# Patient Record
Sex: Female | Born: 1950 | Race: White | Hispanic: No | Marital: Married | Smoking: Never smoker
Health system: Southern US, Community
[De-identification: ages and names within clinical notes are randomized; demographics above are authoritative.]

## PROBLEM LIST (undated history)

## (undated) DIAGNOSIS — G4730 Sleep apnea, unspecified: Secondary | ICD-10-CM

## (undated) DIAGNOSIS — I10 Essential (primary) hypertension: Secondary | ICD-10-CM

## (undated) DIAGNOSIS — K219 Gastro-esophageal reflux disease without esophagitis: Secondary | ICD-10-CM

## (undated) DIAGNOSIS — E7800 Pure hypercholesterolemia, unspecified: Secondary | ICD-10-CM

## (undated) DIAGNOSIS — D126 Benign neoplasm of colon, unspecified: Secondary | ICD-10-CM

## (undated) HISTORY — DX: Gastro-esophageal reflux disease without esophagitis: K21.9

## (undated) HISTORY — DX: Essential (primary) hypertension: I10

## (undated) HISTORY — DX: Benign neoplasm of colon, unspecified: D12.6

## (undated) HISTORY — DX: Pure hypercholesterolemia, unspecified: E78.00

## (undated) HISTORY — DX: Sleep apnea, unspecified: G47.30

---

## 1980-04-16 HISTORY — PX: WISDOM TOOTH EXTRACTION: SHX21

## 1989-04-16 HISTORY — PX: ABDOMINAL HYSTERECTOMY: SHX81

## 2000-02-28 ENCOUNTER — Encounter: Admission: RE | Admit: 2000-02-28 | Discharge: 2000-02-28 | Payer: Self-pay | Admitting: Family Medicine

## 2000-02-28 ENCOUNTER — Encounter: Payer: Self-pay | Admitting: Family Medicine

## 2000-08-28 ENCOUNTER — Encounter: Payer: Self-pay | Admitting: Family Medicine

## 2000-08-28 ENCOUNTER — Ambulatory Visit (HOSPITAL_COMMUNITY): Admission: RE | Admit: 2000-08-28 | Discharge: 2000-08-28 | Payer: Self-pay | Admitting: Family Medicine

## 2000-09-10 ENCOUNTER — Ambulatory Visit (HOSPITAL_COMMUNITY): Admission: RE | Admit: 2000-09-10 | Discharge: 2000-09-10 | Payer: Self-pay | Admitting: Family Medicine

## 2000-09-10 ENCOUNTER — Encounter: Payer: Self-pay | Admitting: Family Medicine

## 2000-09-10 IMAGING — XA IR ANGIO/CAROTID/CERV BI
1 series · 12 of 24 positions shown · IV contrast (omnipaque)
Comparison: none

FINDINGS
CLINICAL DATA: PATIENT WITH A HISTORY OF SEVERE HEADACHES AND PASSING OUT.  MCA STENOSIS ON MRA
EXAMINATION.
CAROTID AND CEREBRAL ARTERIOGRAMS:
FOLLOWING A FULL EXPLANATION OF THE PROCEDURE, ALONG WITH THE POTENTIALLY ASSOCIATED COMPLICATIONS,
AN INFORMED WITNESSED CONSENT WAS OBTAINED.
THE RIGHT GROIN WAS PREPPED AND DRAPED IN THE USUAL STERILE FASHION. THEREAFTER USING A MODIFIED
SELDINGER TECHNIQUE, TRANSFEMORAL ACCESS INTO THE RIGHT COMMON FEMORAL ARTERY WAS OBTAINED.
OVER A .035 INCH GUIDEWIRE, A SIX FRENCH PINNACLE SHEATH WAS INSERTED.
THROUGH THIS AND ALSO OVER A .035 INCH GUIDEWIRE, A FIVE FRENCH JB-1 CATHETER WAS ADVANCED TO THE
AORTIC ARCH REGION AND SELECTIVE CANNULATION ARTERIOGRAMS WERE PERFORMED OF THE RIGHT COMMON
CAROTID ARTERY, THE RIGHT VERTEBRAL ARTERY, THE LEFT VERTEBRAL ARTERY, AND THE LEFT COMMON CAROTID
ARTERY. THERE WERE NO ACUTE COMPLICATIONS AND THE PATIENT TOLERATED THE PROCEDURE WELL.
MEDICATIONS UTILIZED:  VERSED 1 MG IV X 1.
CONTRAST  UTILIZED:  OMNIPAQUE 300, APPROXIMATELY 95 CC.

[Series 1: run · 12 of 81 slices shown]
[im 4/81]
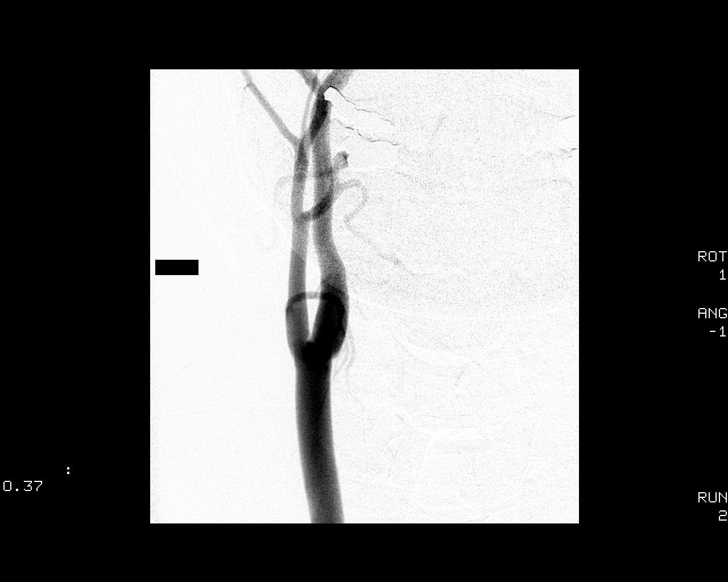
[im 11/81]
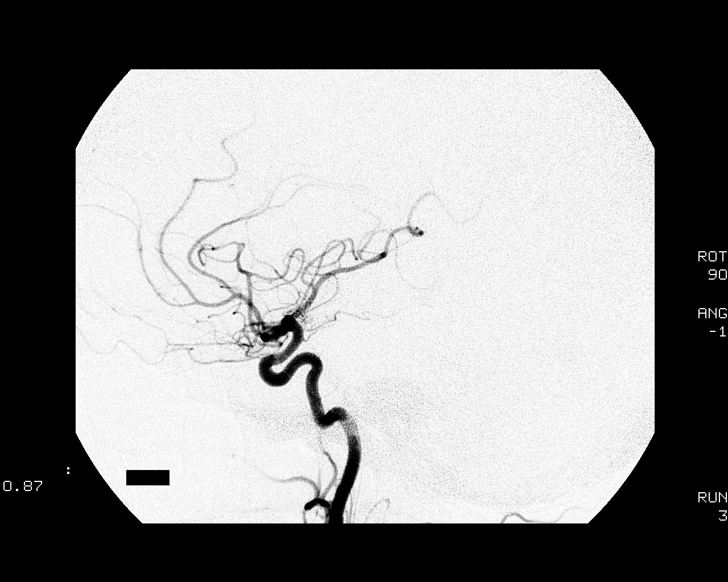
[im 18/81]
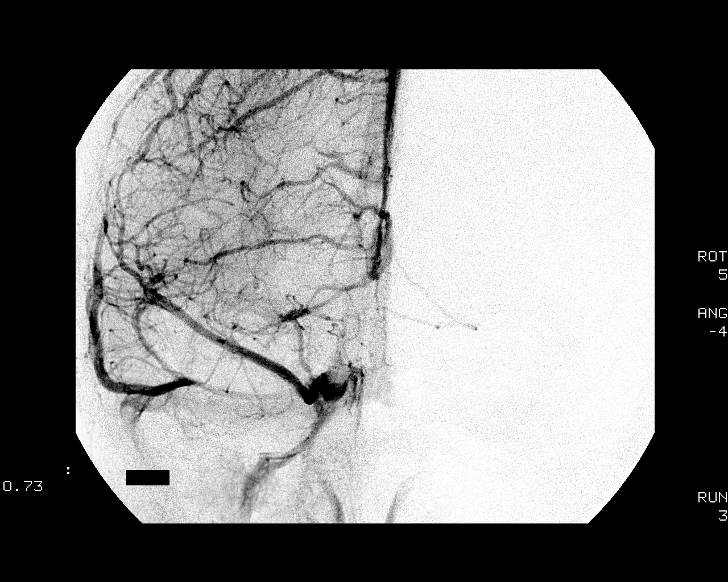
[im 25/81]
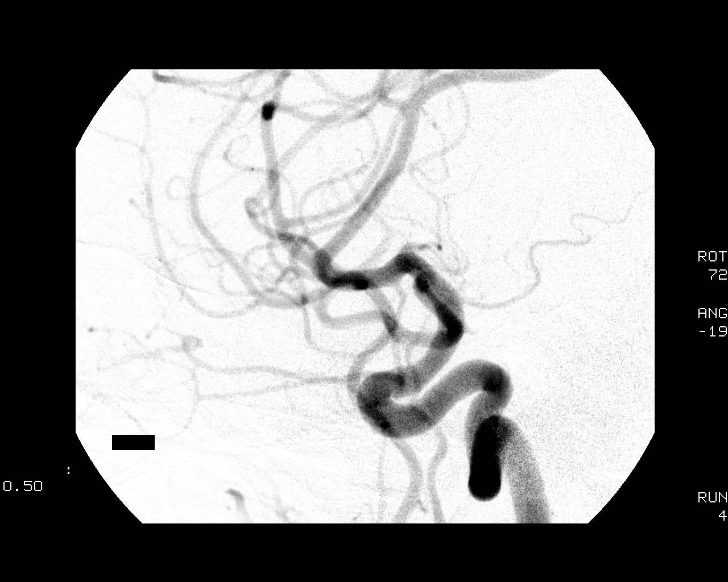
[im 32/81]
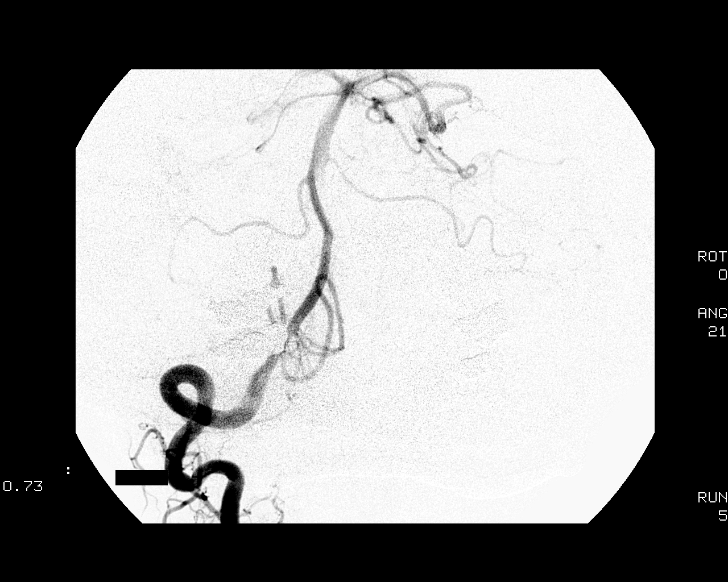
[im 39/81]
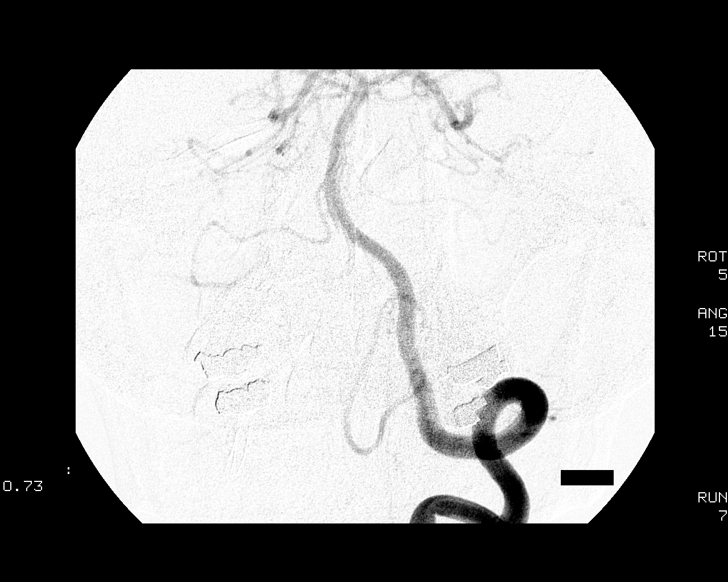
[im 46/81]
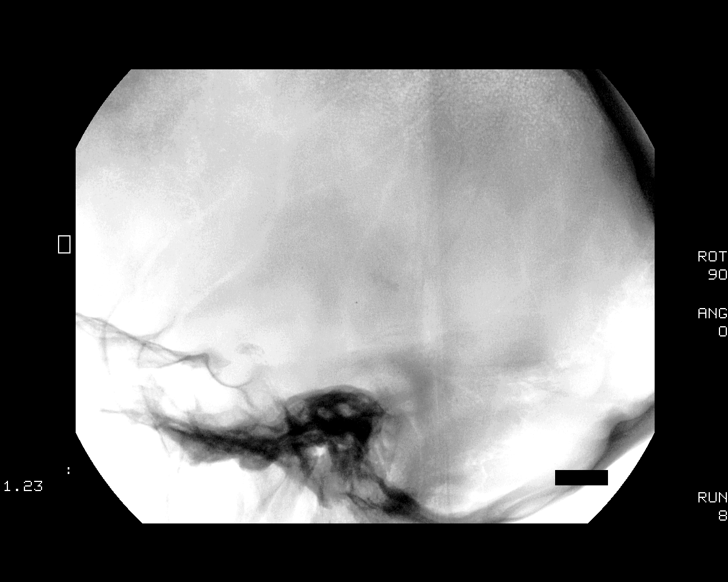
[im 53/81]
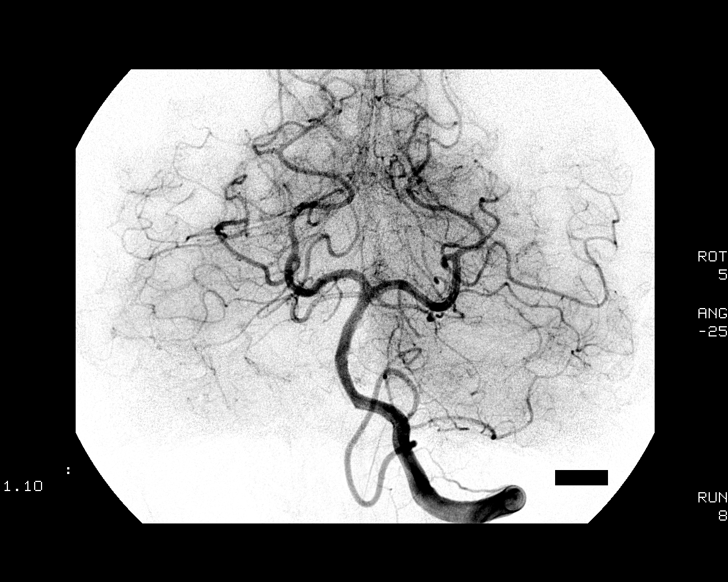
[im 60/81]
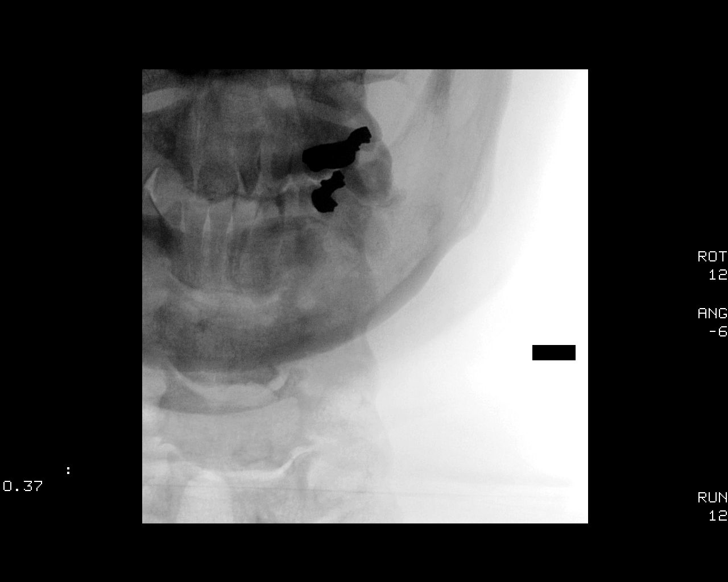
[im 67/81]
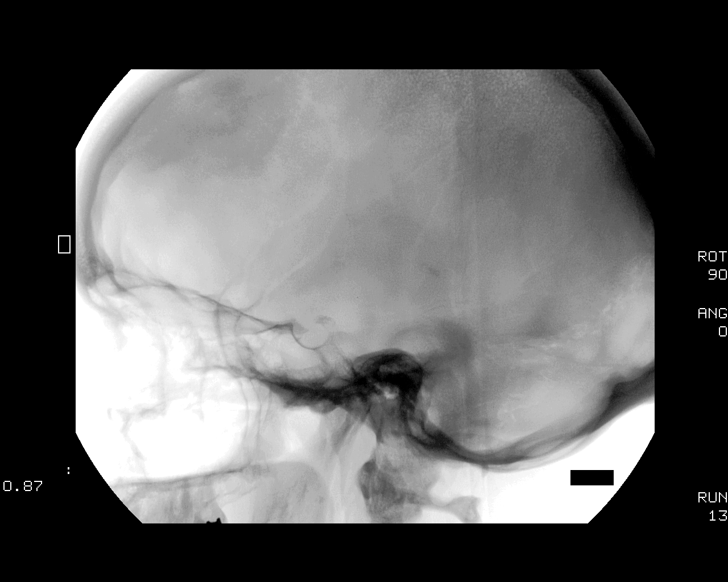
[im 74/81]
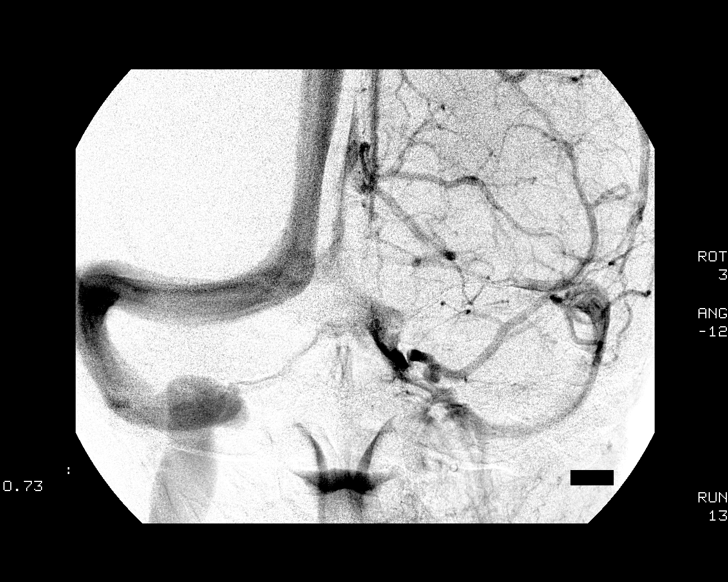
[im 81/81]
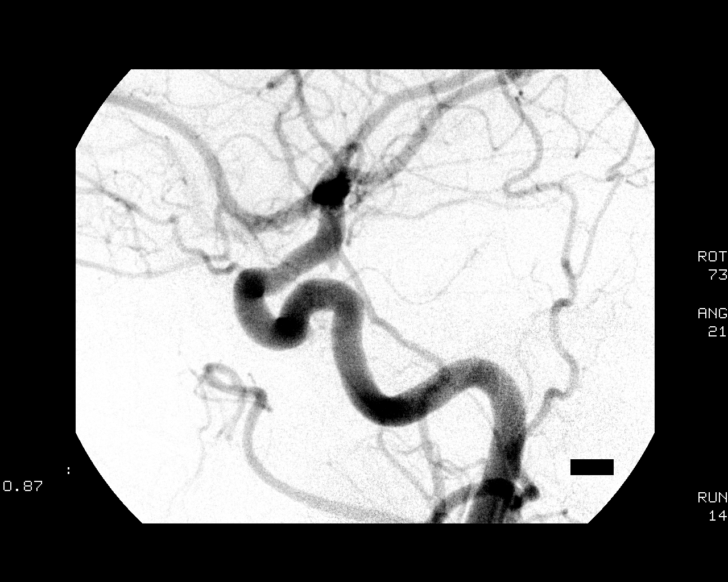

[12 of 24 positions shown; findings below may reference images not displayed]

FINDINGS: THE RIGHT COMMON CAROTID ARTERIOGRAM DEMONSTRATES THE RIGHT EXTERNAL CAROTID ARTERY ORIGIN AND
BRANCHES TO BE NORMAL. THE LEFT INTERNAL CAROTID ARTERY AT THE BULB IS UNREMARKABLE.
AT THE LEVEL OF APPROXIMATELY C-1, THERE IS MINIMAL SMOOTH IRREGULARITY INVOLVING THE RIGHT ICA.
MORE DISTALLY, THE RIGHT INTERNAL CAROTID ARTERY APPEARS NORMAL. THE PETROUS, CAVERNOUS, AND THE
SUPRACLINOID SEGMENTS ARE NORMAL. THE RIGHT MIDDLE AND THE RIGHT ANTERIOR CEREBRAL ARTERIES OPACIFY
NORMALLY INTO THE CAPILLARY AND THE VENOUS PHASES.
THE RIGHT VERTEBRAL ARTERY ORIGIN IS NORMAL. ITS VESSEL ASCENDS NORMALLY TO THE CRANIAL SKULL BASE.
THERE IS NORMAL OPACIFICATION OF THE RIGHT POSTERIOR  INFERIOR CEREBELLAR ARTERY, WITH THE
VISUALIZED SEGMENTS OF THE BASILAR ARTERY, POSTERIOR CEREBRAL ARTERIES, AND THE SUPERIOR CEREBELLAR
ARTERIES BEING NORMALLY OPACIFIED.
THE LEFT VERTEBRAL ARTERY IS THE DOMINATE VERTEBRAL ARTERY WITH A NORMAL ORIGIN. THE VESSEL ASCENDS
NORMALLY TO THE CRANIAL SKULL BASE. THE LEFT POSTERIOR INFERIOR CEREBELLAR ARTERY, THE BASILAR
ARTERY, THE POSTERIOR CEREBRAL ARTERIES, THE SUPERIOR CEREBELLAR ARTERIES, AND THE ANTERIOR
INFERIOR CEREBELLAR ARTERIES ARE NORMALLY OPACIFIED INTO THE CAPILLARY AND VENOUS PHASES.
THE LEFT COMMON CAROTID ARTERIOGRAM DEMONSTRATES THE LEFT EXTERNAL CAROTID ARTERY ORIGIN AND
BRANCHES TO BE NORMAL. THE LEFT INTERNAL CAROTID ARTERY ASCENDS NORMALLY TO THE CRANIAL SKULL BASE.
THE PETROUS, CAVERNOUS, AND THE SUPRACLINOID SEGMENTS ARE NORMAL. THERE IS AN INFUNDIBULUM AT THE
ORIGIN OF THE LEFT PCOM.
THE LEFT MIDDLE AND THE LEFT ANTERIOR CEREBRAL ARTERIES OPACIFY NORMALLY INTO THE CAPILLARY AND THE
VENOUS PHASES.
IMPRESSION
MINIMAL SMOOTH IRREGULARITY IN THE RIGHT INTERNAL CAROTID ARTERY AT C-1. THIS MAY REPRESENT MILD
FMD LIKE CHANGES WHICH ARE NOT SIGNIFICANT HEMODYNAMICALLY.

## 2001-03-04 ENCOUNTER — Encounter: Payer: Self-pay | Admitting: Family Medicine

## 2001-03-04 ENCOUNTER — Encounter: Admission: RE | Admit: 2001-03-04 | Discharge: 2001-03-04 | Payer: Self-pay | Admitting: Family Medicine

## 2002-03-13 ENCOUNTER — Encounter: Admission: RE | Admit: 2002-03-13 | Discharge: 2002-03-13 | Payer: Self-pay | Admitting: Family Medicine

## 2002-03-13 ENCOUNTER — Encounter: Payer: Self-pay | Admitting: Family Medicine

## 2002-11-25 ENCOUNTER — Emergency Department (HOSPITAL_COMMUNITY): Admission: EM | Admit: 2002-11-25 | Discharge: 2002-11-25 | Payer: Self-pay | Admitting: Emergency Medicine

## 2002-11-25 ENCOUNTER — Encounter: Payer: Self-pay | Admitting: Emergency Medicine

## 2003-04-29 ENCOUNTER — Encounter: Admission: RE | Admit: 2003-04-29 | Discharge: 2003-04-29 | Payer: Self-pay | Admitting: Family Medicine

## 2004-04-19 ENCOUNTER — Encounter: Admission: RE | Admit: 2004-04-19 | Discharge: 2004-04-19 | Payer: Self-pay | Admitting: Gastroenterology

## 2004-04-19 IMAGING — RF DG ESOPHAGUS
16 series · 16 of 16 positions shown · non-contrast
Comparison: none

CLINICAL DATA: Dysphagia.  Reflux. 
 ESOPHAGEAL STUDY:
 With the aid of fluoroscopic visualization, barium was seen to pass freely through the esophagus. There was noted a moderate sized hiatal hernia with some reflux but no obstruction to the passage of a 3 x 13 mm barium tablet.  The esophageal mucosa appears to be within the normal limit.

[Series 1: run · 1 of 1 slices shown (1 of 15)]
[im 1/1]
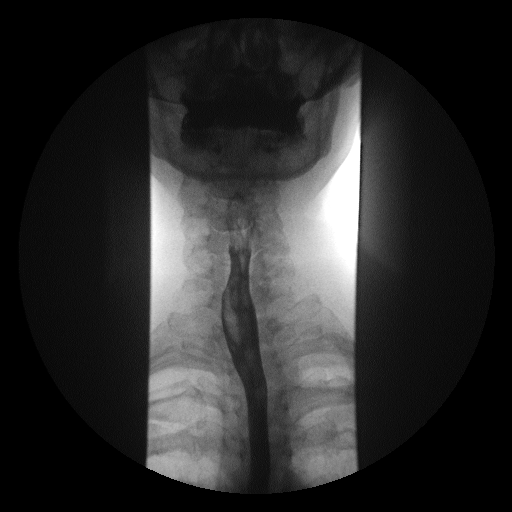

[Series 2: run · 1 of 1 slices shown (2 of 15)]
[im 1/1]
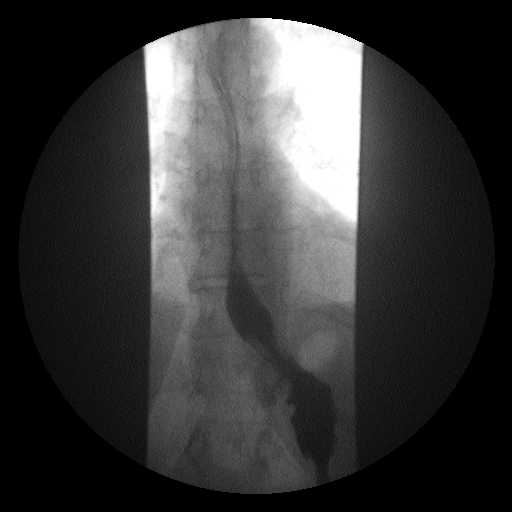

[Series 3: run · 1 of 1 slices shown (3 of 15)]
[im 1/1]
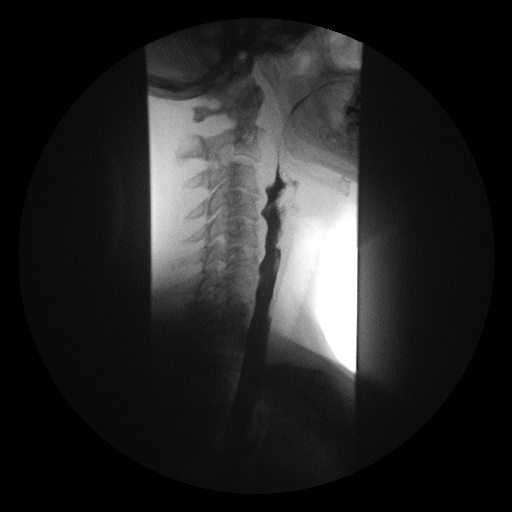

[Series 4: run · 1 of 1 slices shown (4 of 15)]
[im 1/1]
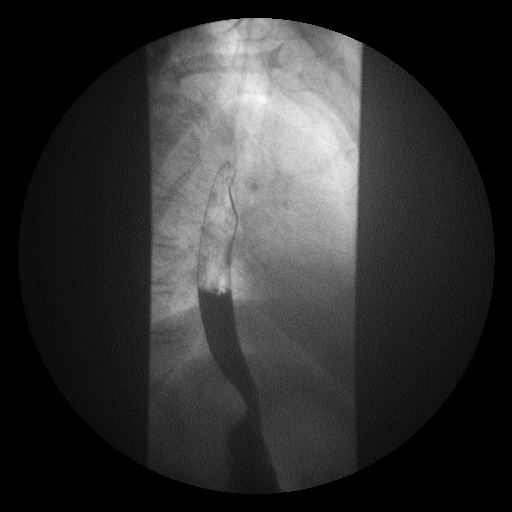

[Series 5: run · 1 of 1 slices shown (5 of 15)]
[im 1/1]
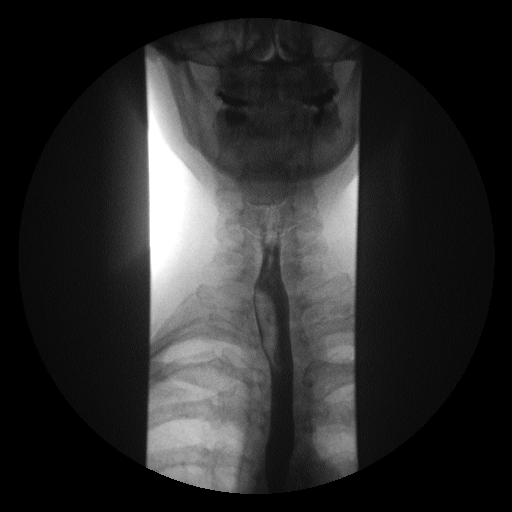

[Series 6: run · 1 of 1 slices shown (6 of 15)]
[im 1/1]
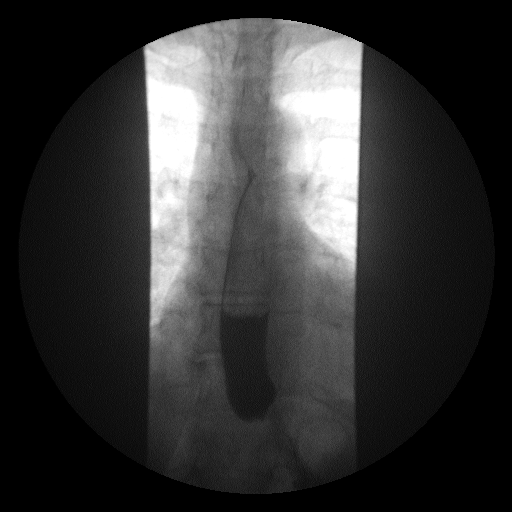

[Series 7: run · 1 of 1 slices shown (7 of 15)]
[im 1/1]
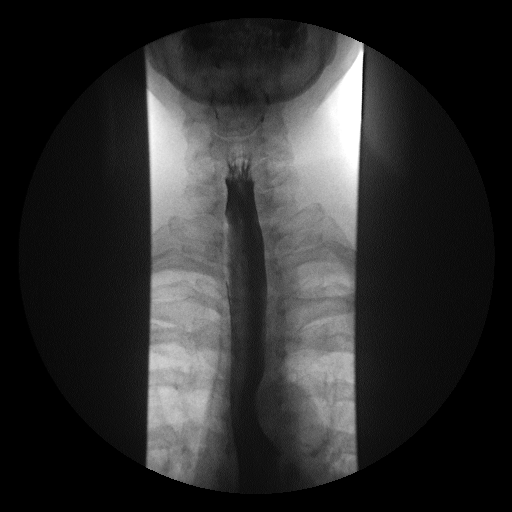

[Series 8: run · 1 of 1 slices shown (8 of 15)]
[im 1/1]
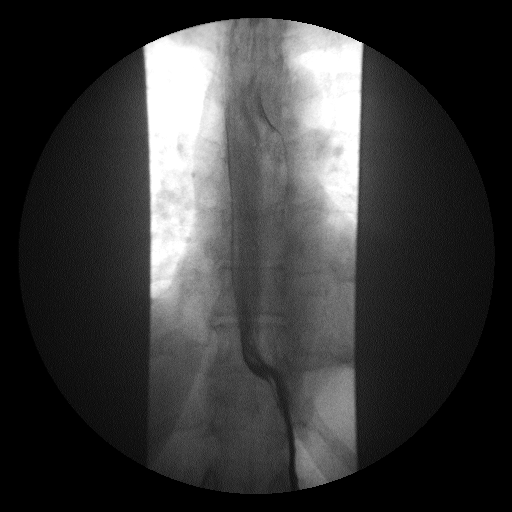

[Series 9: run · 1 of 1 slices shown (9 of 15)]
[im 1/1]
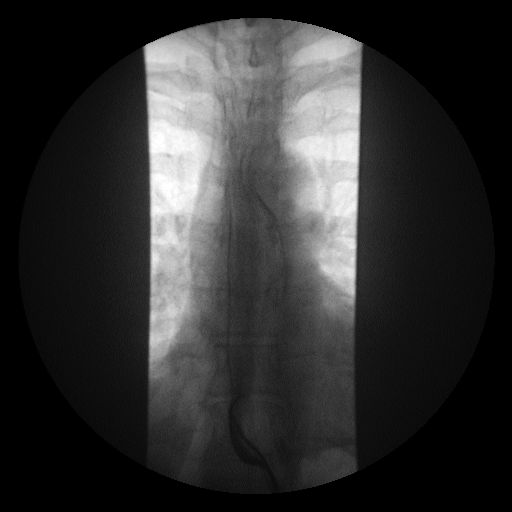

[Series 10: run · 1 of 1 slices shown (10 of 15)]
[im 1/1]
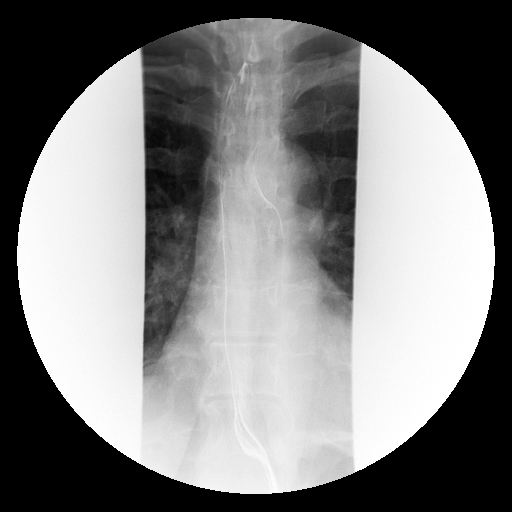

[Series 11: run · 1 of 1 slices shown (11 of 15)]
[im 1/1]
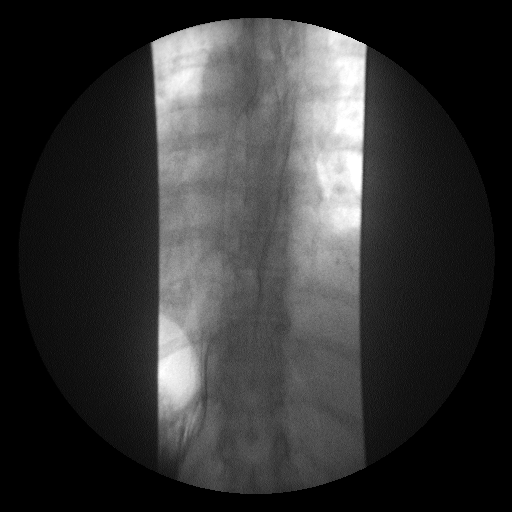

[Series 12: run · 1 of 1 slices shown (12 of 15)]
[im 1/1]
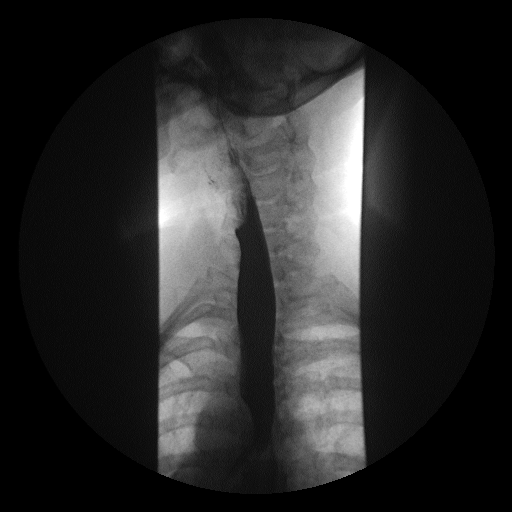

[Series 13: run · 1 of 1 slices shown (13 of 15)]
[im 1/1]
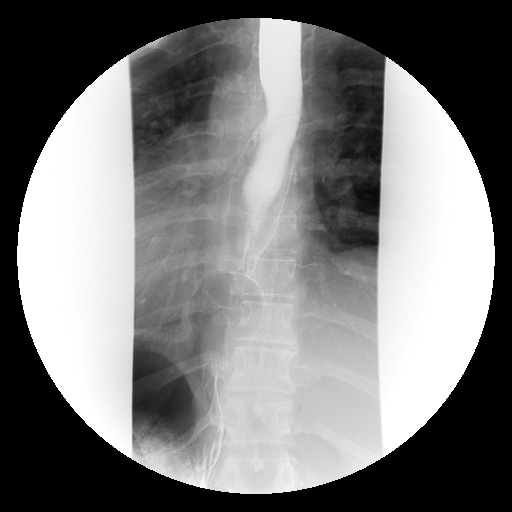

[Series 14: run · 1 of 1 slices shown (14 of 15)]
[im 1/1]
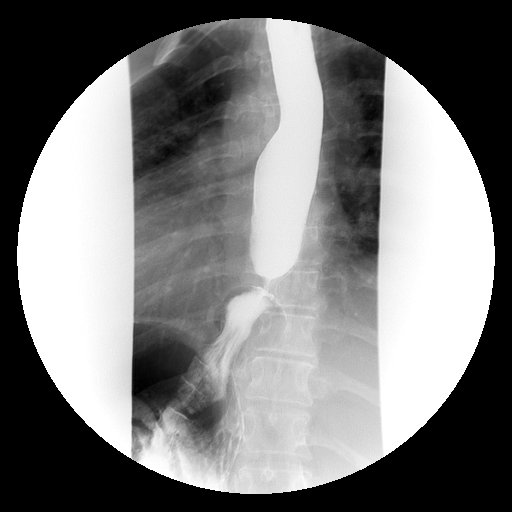

[Series 15: run · 1 of 1 slices shown (15 of 15)]
[im 1/1]
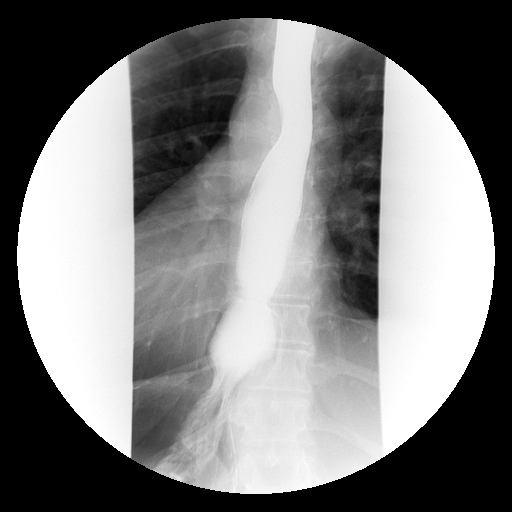

[Series 1001: view not recorded · 0.20mm/px · 1 of 1 slices shown]
[im 1/1]
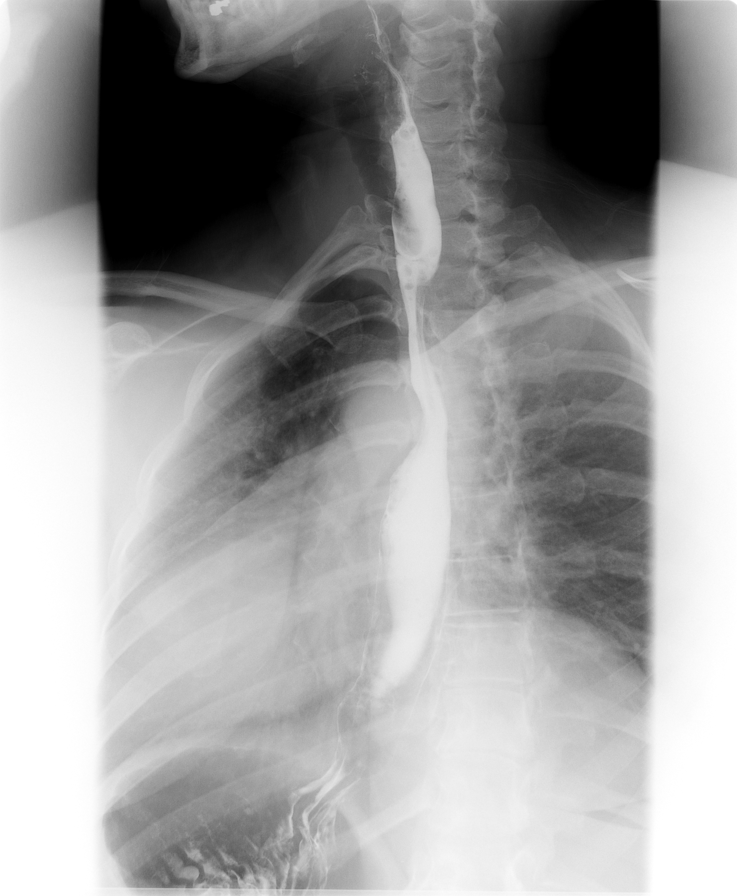

[16 of 16 positions shown; findings below may reference images not displayed]

IMPRESSION: Hiatal hernia with reflux but no obstruction of a 3 x 13 mm barium tablet.

## 2004-05-01 ENCOUNTER — Encounter: Admission: RE | Admit: 2004-05-01 | Discharge: 2004-05-01 | Payer: Self-pay | Admitting: Family Medicine

## 2004-05-05 ENCOUNTER — Ambulatory Visit: Payer: Self-pay | Admitting: Gastroenterology

## 2004-05-22 ENCOUNTER — Ambulatory Visit: Payer: Self-pay | Admitting: Gastroenterology

## 2005-04-30 ENCOUNTER — Ambulatory Visit (HOSPITAL_COMMUNITY): Admission: RE | Admit: 2005-04-30 | Discharge: 2005-04-30 | Payer: Self-pay | Admitting: Family Medicine

## 2005-04-30 IMAGING — US US TRANSVAGINAL NON-OB
1 series · 18 of 25 positions shown · non-contrast
Comparison: none

CLINICAL DATA: Evaluate right ovarian enlargement.  The patient is status post hysterectomy.
TRANSABDOMINAL AND ENDOVAGINAL PELVIC ULTRASOUND:

[Series 1: us transvaginal non-ob · 18 of 27 slices shown]
[im 1/27]
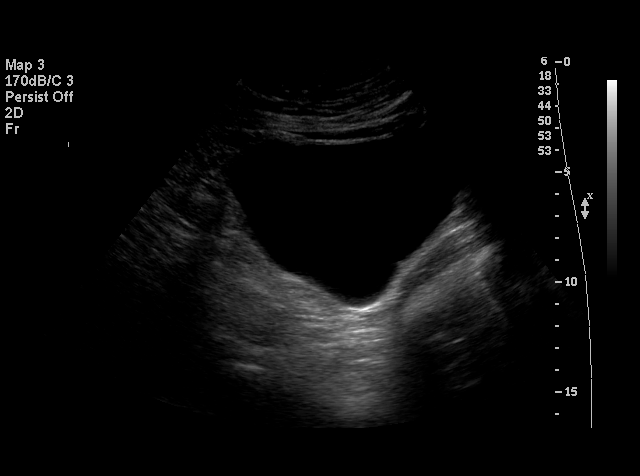
[im 3/27]
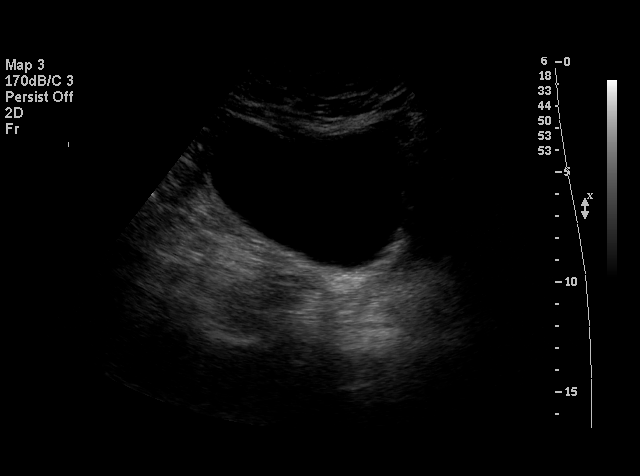
[im 4/27]
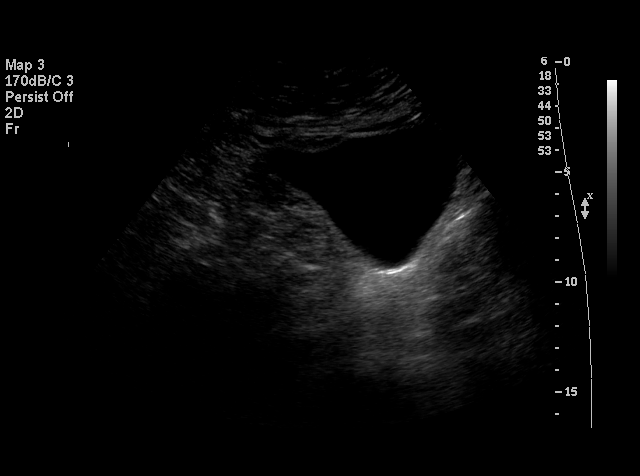
[im 5/27]
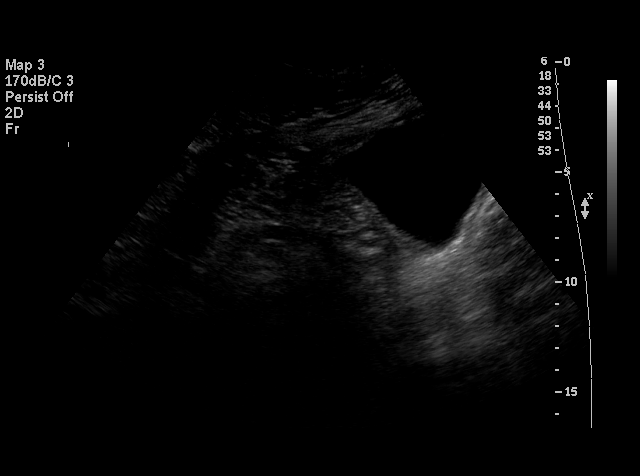
[im 7/27]
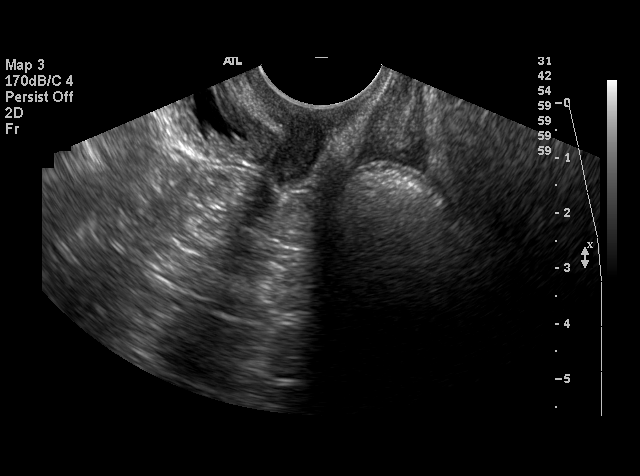
[im 8/27]
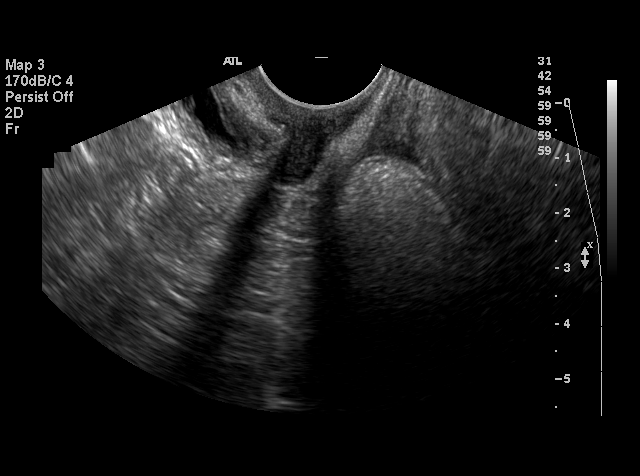
[im 10/27]
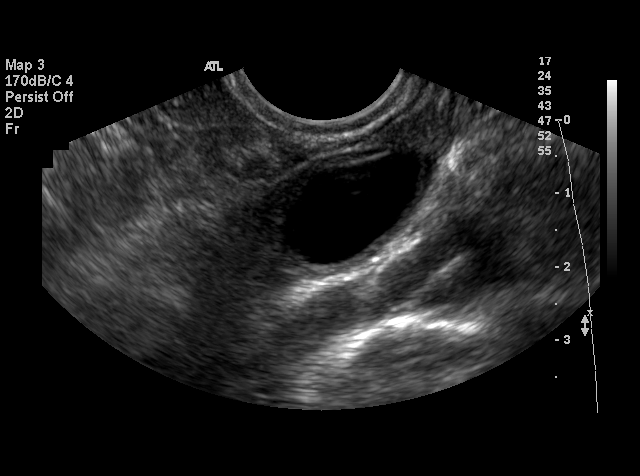
[im 11/27]
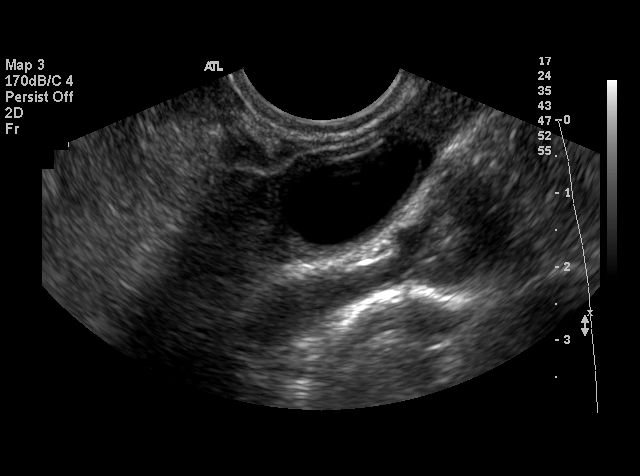
[im 12/27]
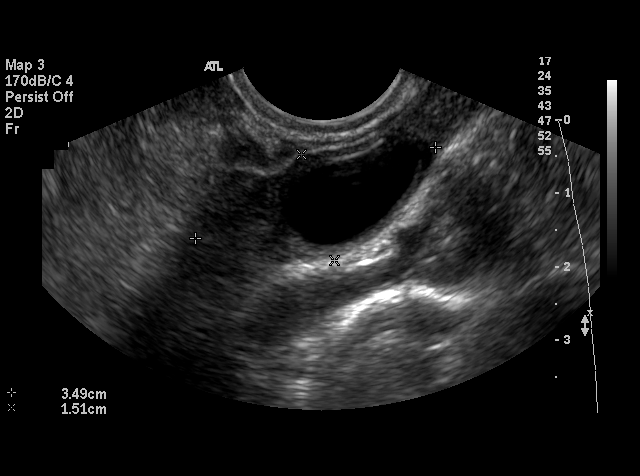
[im 15/27]
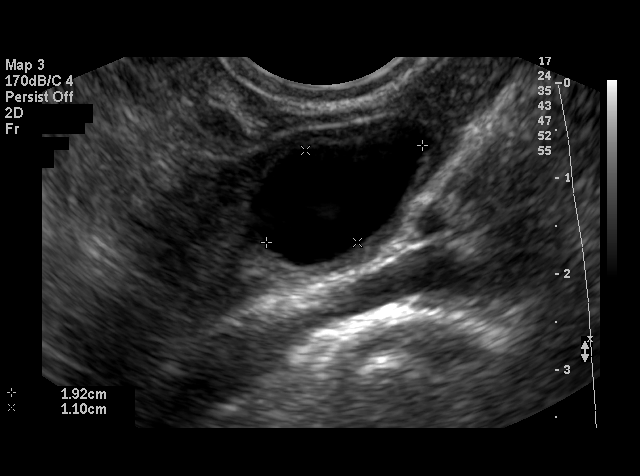
[im 16/27]
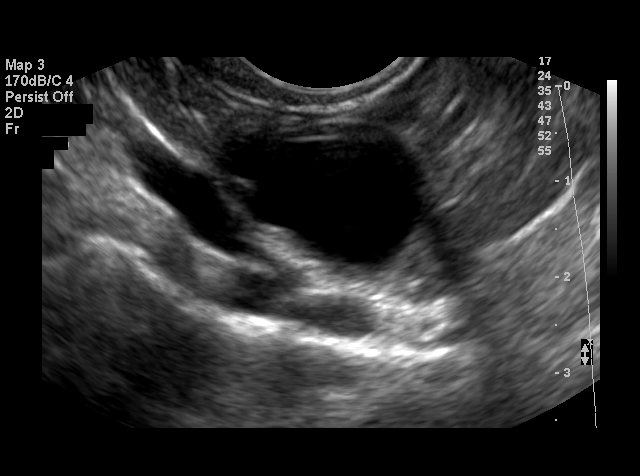
[im 17/27]
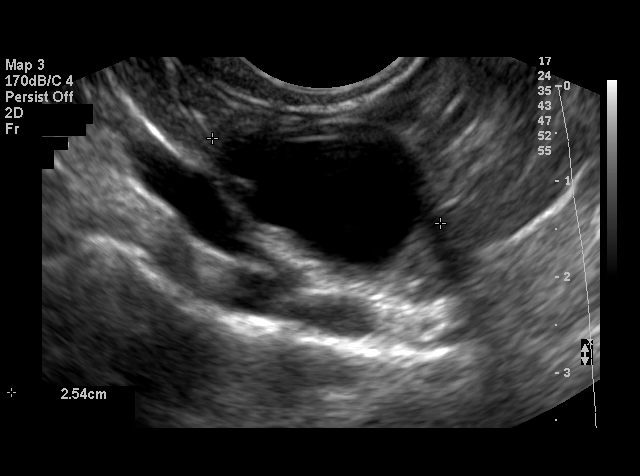
[im 19/27]
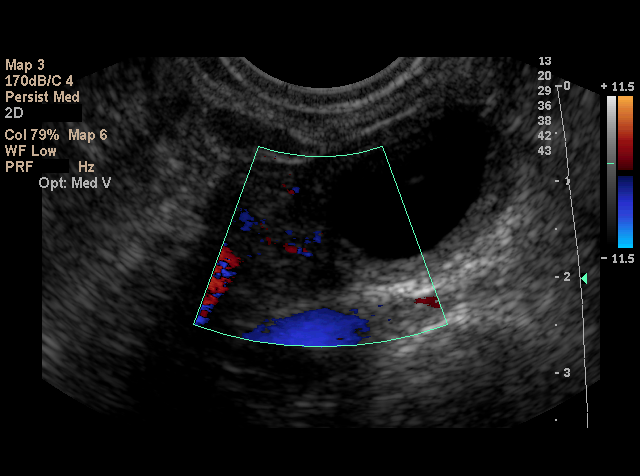
[im 20/27]
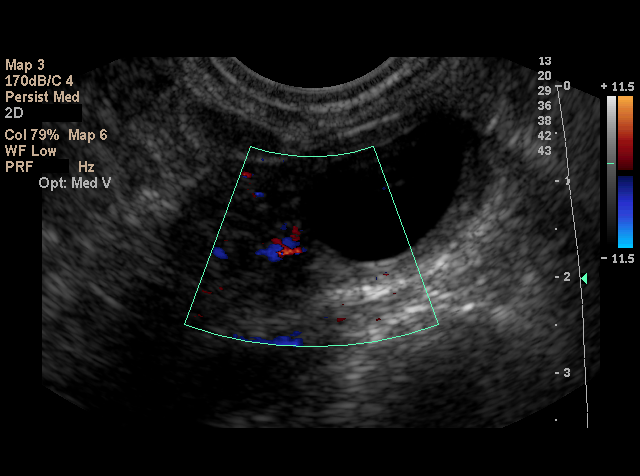
[im 22/27]
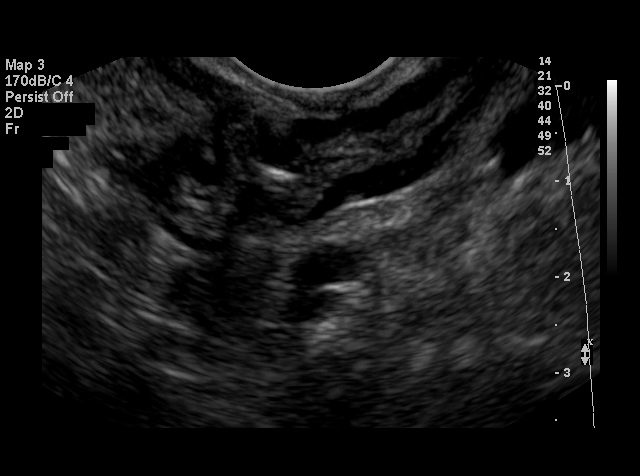
[im 23/27]
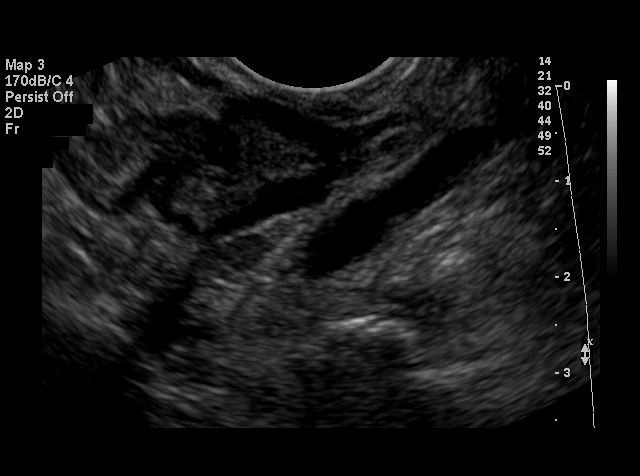
[im 24/27]
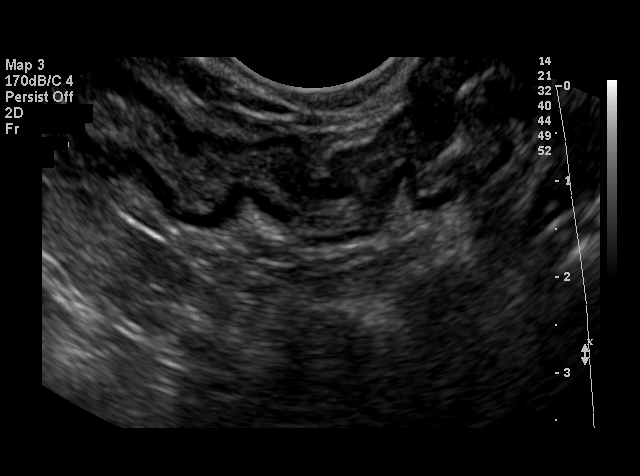
[im 27/27]
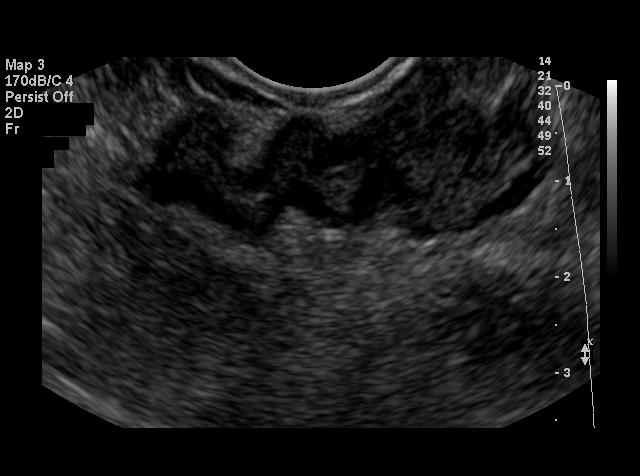

[18 of 25 positions shown; findings below may reference images not displayed]

FINDINGS: Multiple images of the pelvis were obtained using a transabdominal and endovaginal approaches. 
A normal vaginal cuff is seen.  
The right ovary measures 3.5 x 1.5 x 2.5 cm and contains an unilocular simple cyst measuring 1.9 x 1.1 x 1.5 cm.  A normal color flow appearance is seen to this ovary and no intraluminal flow is identified.  
The left ovary could not be seen with confidence.  No focal left adnexal masses are seen.  No pelvic fluid is noted.
IMPRESSION: 1.  Normal vaginal cuff.
2.  Simple right ovarian cyst.  Given the appearance and the patient?s age it is suspected that this represents a benign postmenopausal cyst.  Short term follow-up would be recommended in 3 months to assess for stability of this suspected benign process.  As this measures greater than 1 cm in size, if the patient is felt to be post-menopausal, correlation with CA-[DATE] be useful as well.

## 2005-09-12 ENCOUNTER — Ambulatory Visit (HOSPITAL_COMMUNITY): Admission: RE | Admit: 2005-09-12 | Discharge: 2005-09-12 | Payer: Self-pay | Admitting: Family Medicine

## 2005-09-12 IMAGING — US US TRANSVAGINAL NON-OB
1 series · 18 of 25 positions shown · non-contrast
Comparison: [DATE].

CLINICAL DATA: Follow-up postmenopausal ovarian cyst.  Previous hysterectomy.
 TRANSVAGINAL PELVIC ULTRASOUND:
TECHNIQUE: Transvaginal ultrasound examination of the pelvis was performed including evaluation of the uterus, ovaries, adnexal regions, and pelvic cul-de-sac.

[Series 1: us transvaginal non-ob · 18 of 27 slices shown]
[im 1/27]
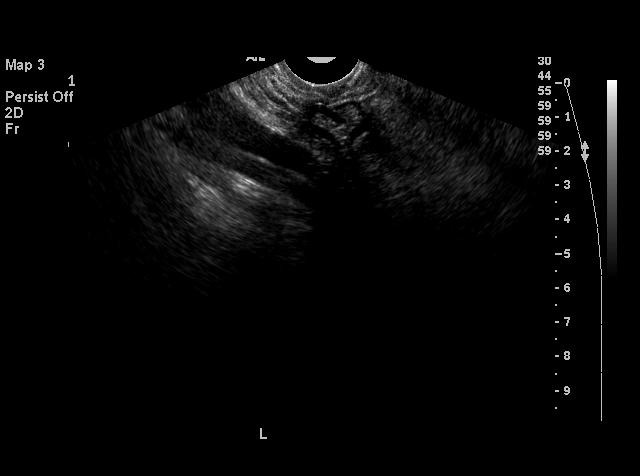
[im 3/27]
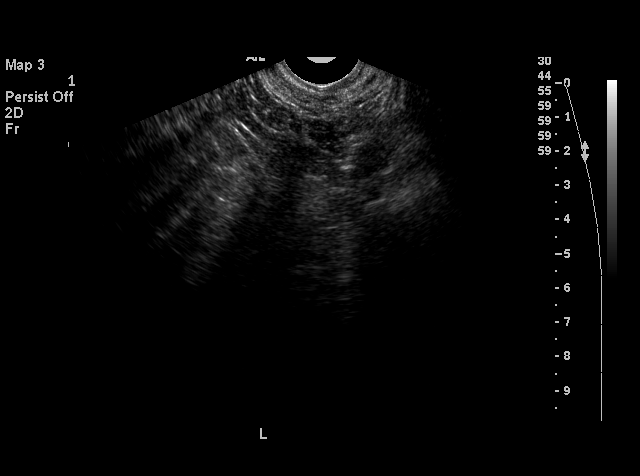
[im 4/27]
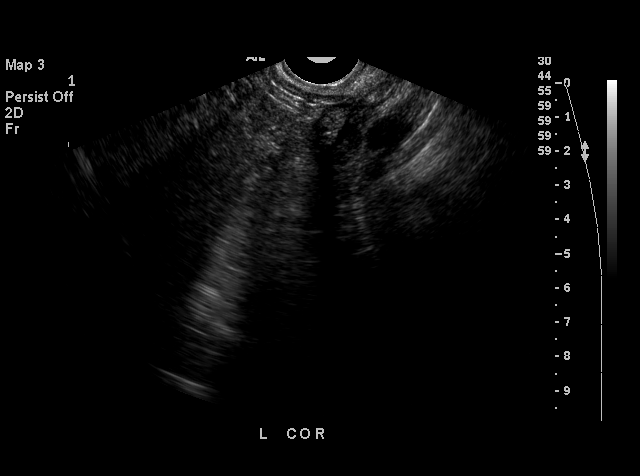
[im 5/27]
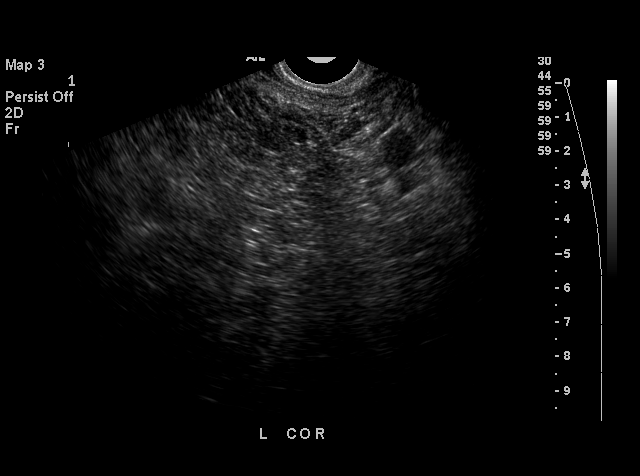
[im 7/27]
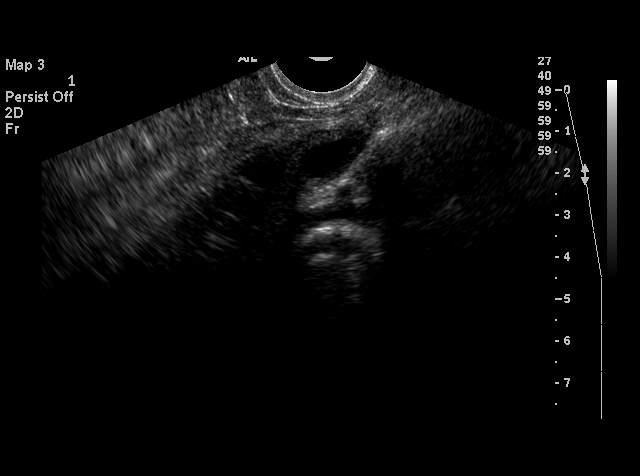
[im 8/27]
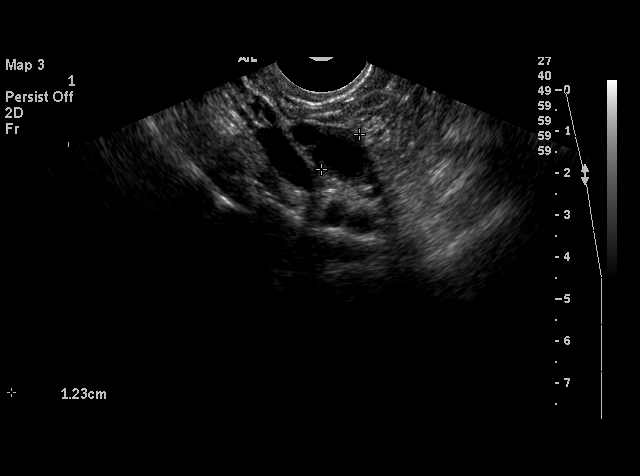
[im 10/27]
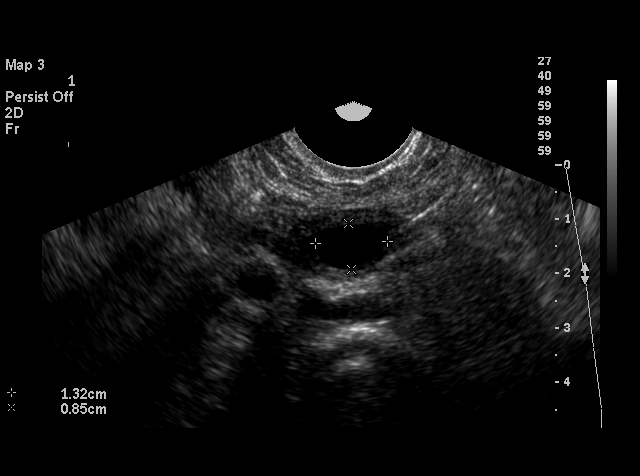
[im 11/27]
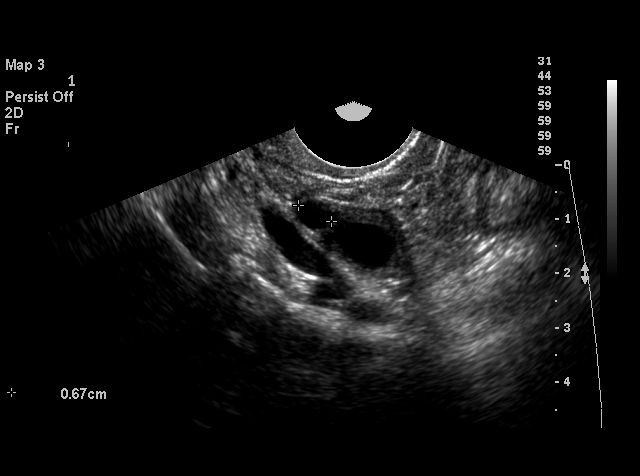
[im 12/27]
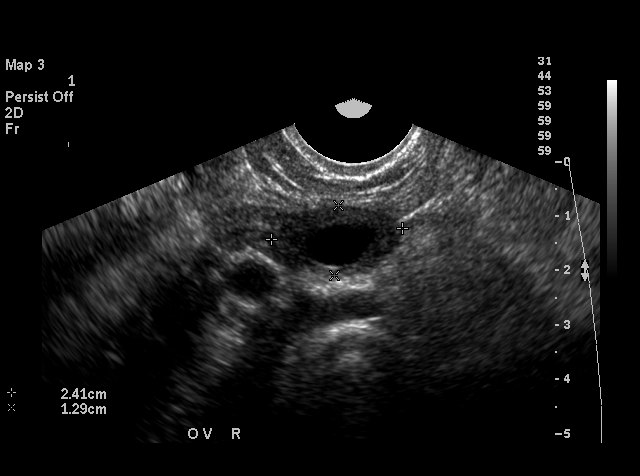
[im 15/27]
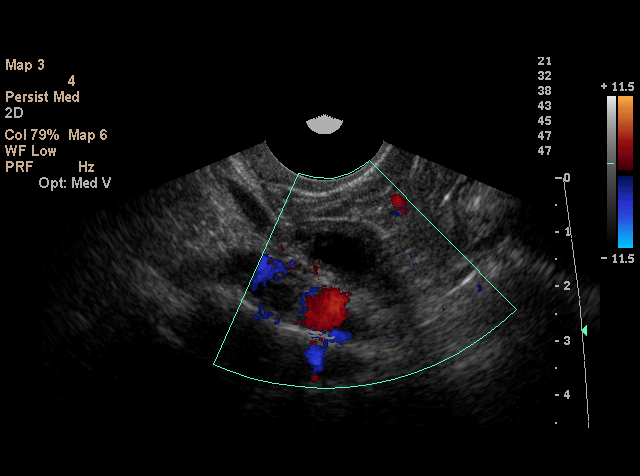
[im 16/27]
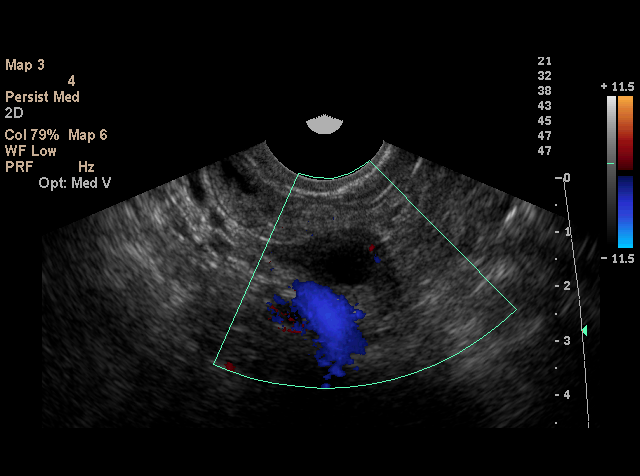
[im 17/27]
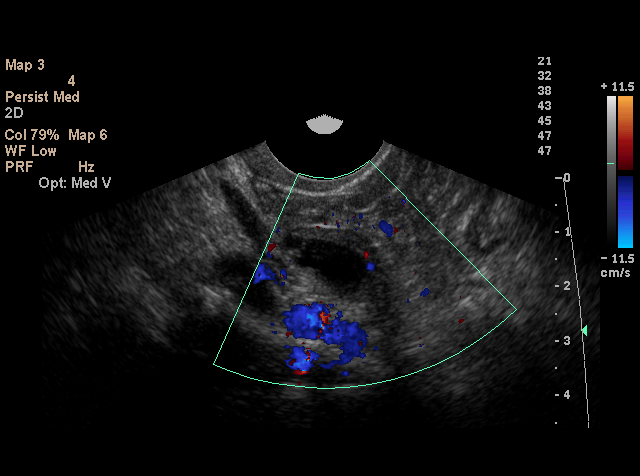
[im 19/27]
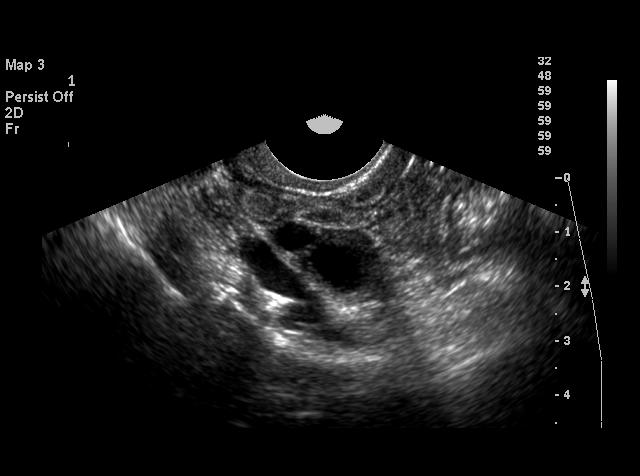
[im 20/27]
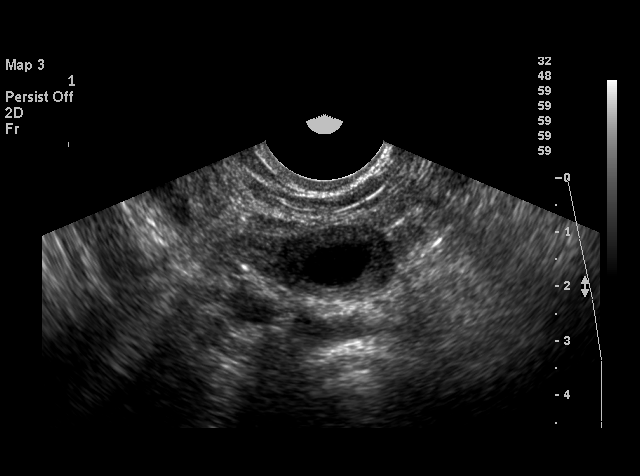
[im 22/27]
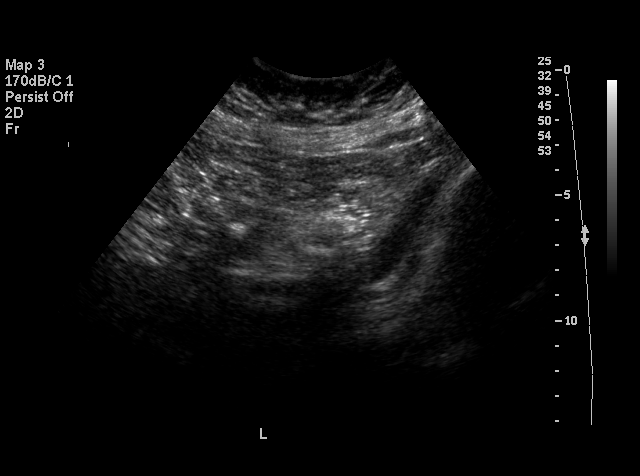
[im 23/27]
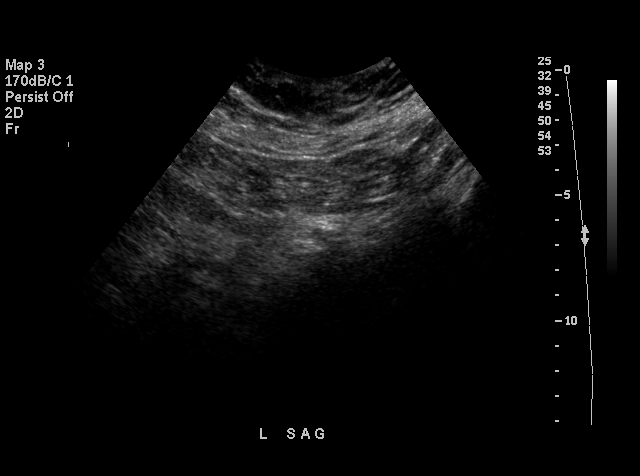
[im 24/27]
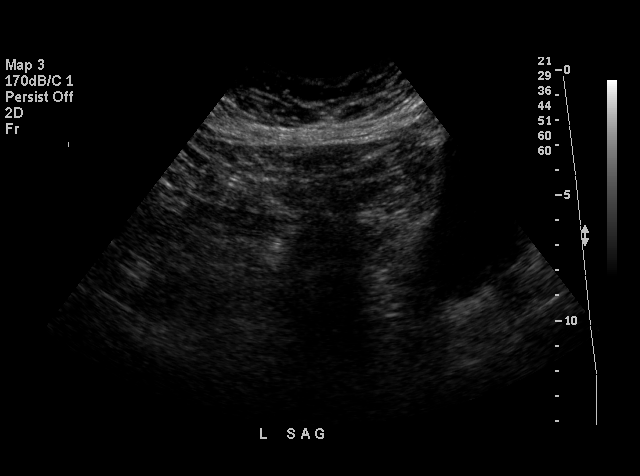
[im 27/27]
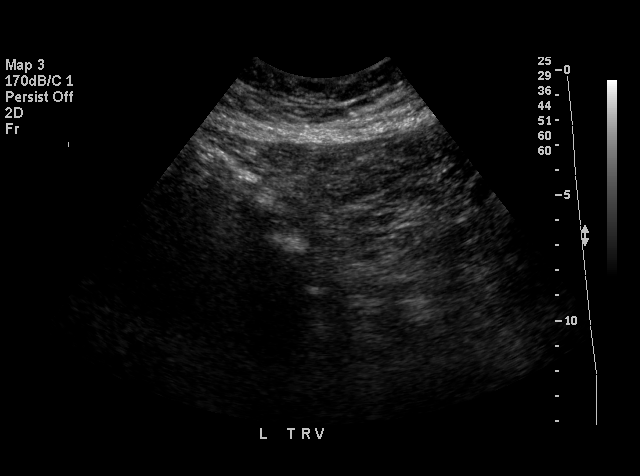

[18 of 25 positions shown; findings below may reference images not displayed]

FINDINGS: The uterus is surgically absent.  The left ovary is not directly visualized but no left adnexal masses or cysts are identified.  
 The right ovary contains 2 simple cysts, the largest measuring 1.3 x 0.9 cm.  These have benign sonographic features and the largest cyst is mildly decreased in size compared with prior study.  No complex cysts or masses are seen.  There is no evidence of free fluid.
IMPRESSION: 1.  Stable simple right ovarian cysts, largest measuring 1.3 cm.  This has benign sonographic features.  Follow-up ultrasound is recommended again in 6 to 9 months to confirm continued stability.
 2.  Previous hysterectomy.  Nonvisualization of the left ovary.

## 2006-04-01 ENCOUNTER — Encounter: Admission: RE | Admit: 2006-04-01 | Discharge: 2006-04-01 | Payer: Self-pay | Admitting: Family Medicine

## 2006-04-01 IMAGING — MG MM SCREEN MAMMOGRAM BILATERAL
4 series · 4 of 4 positions shown · non-contrast
Comparison: none

DG SCREEN MAMMOGRAM BILATERAL
Bilateral CC and MLO view(s) were taken.

DIGITAL SCREENING MAMMOGRAM WITH CAD:
There is a fibroglandular pattern.  No masses or malignant type calcifications are identified.  
Compared with prior studies.

[R CC]
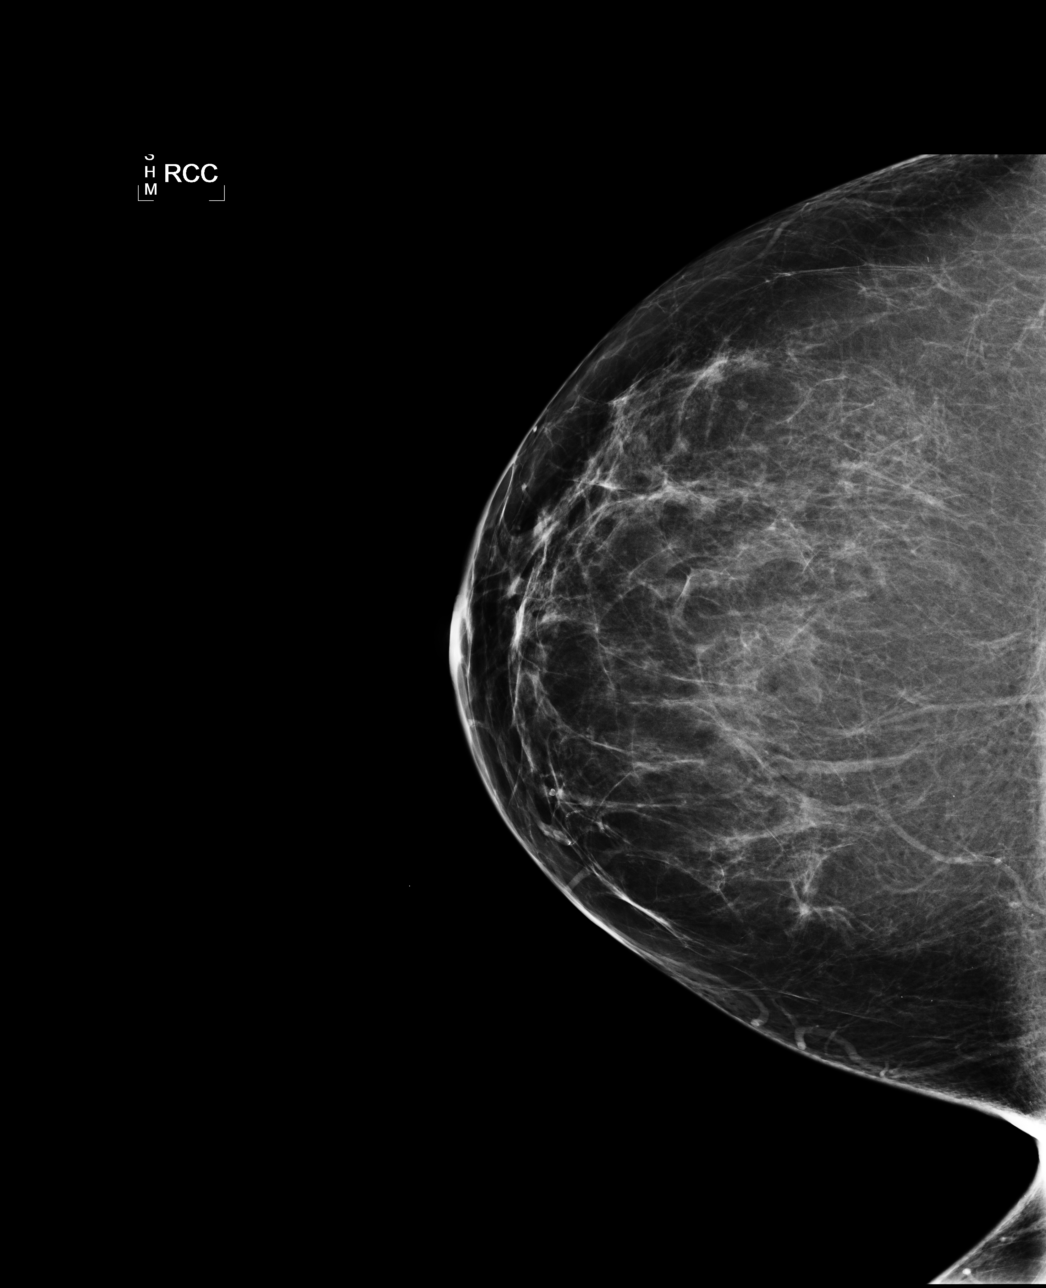

[L CC]
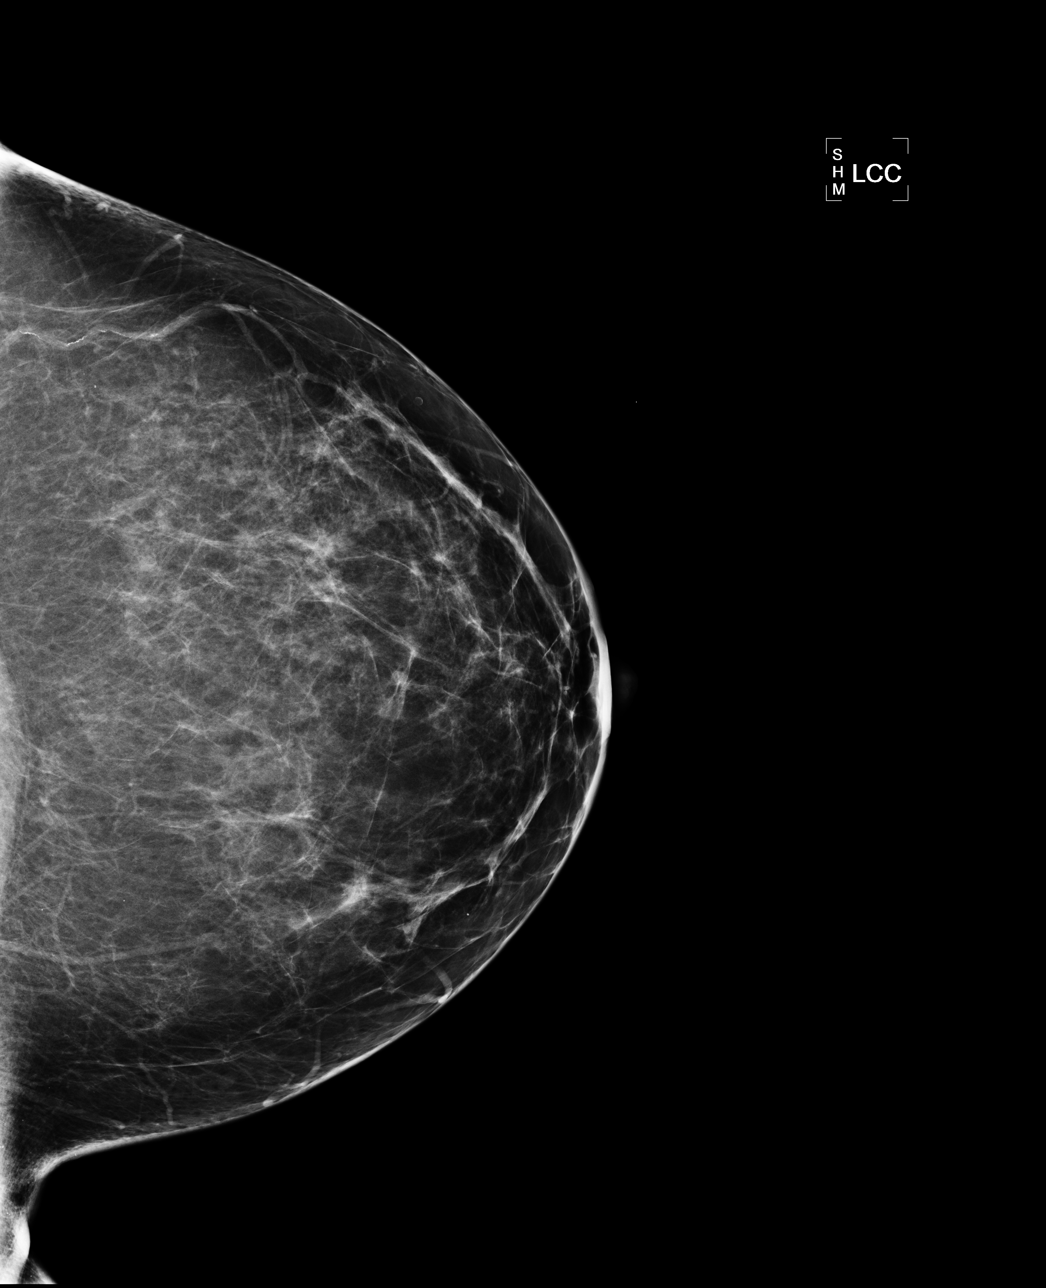

[L MLO]
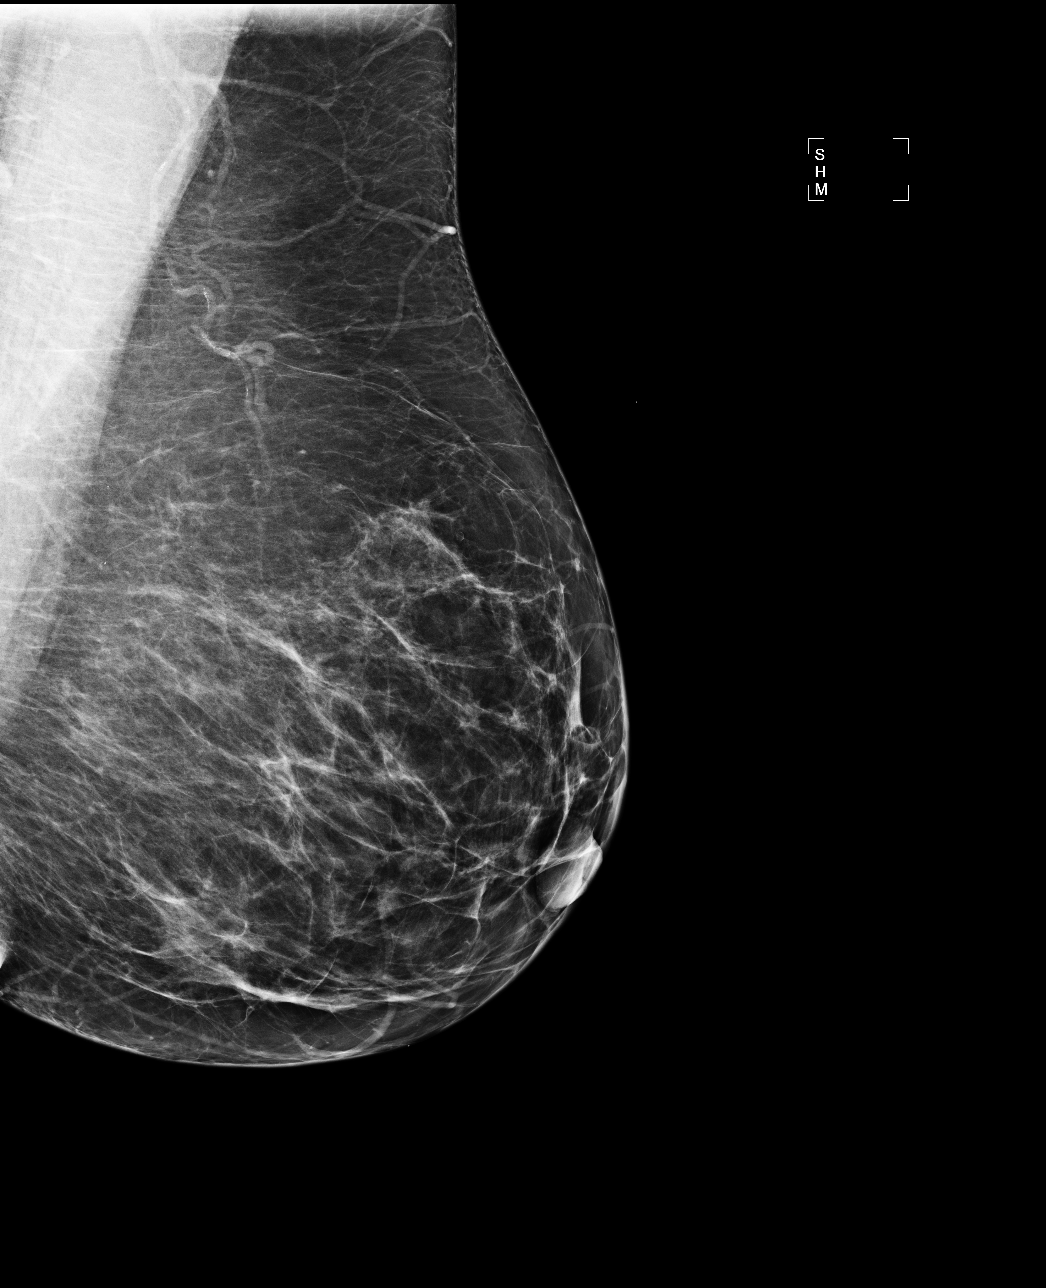

[R MLO]
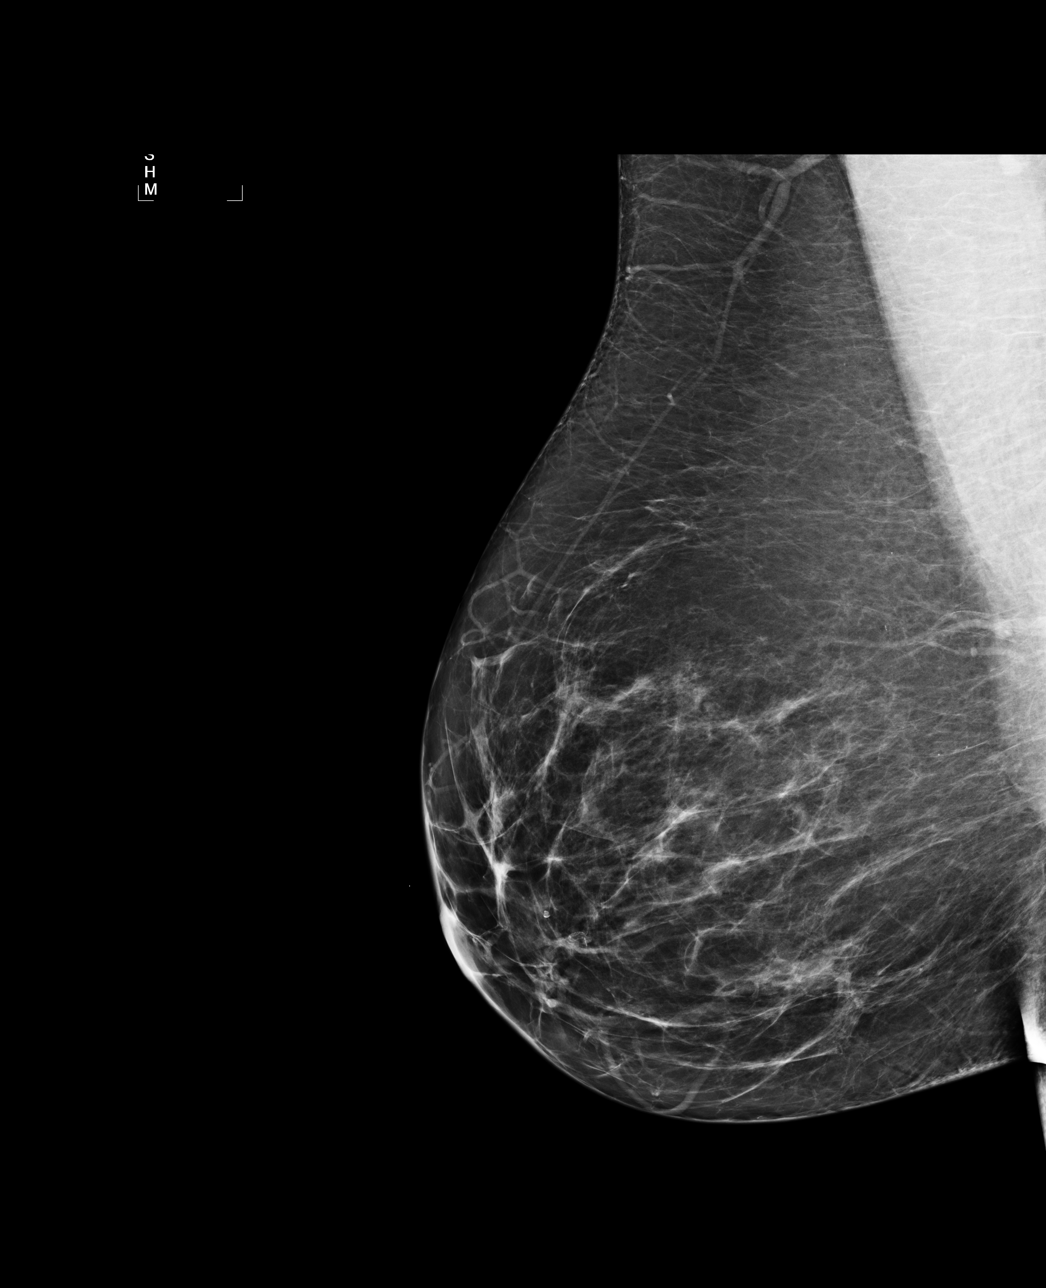

[4 of 4 positions shown; findings below may reference images not displayed]

IMPRESSION: No specific mammographic evidence of malignancy.  Next screening mammogram is recommended in one 
year.

ASSESSMENT: Negative - BI-RADS 1

Screening mammogram in 1 year.
ANALYZED BY COMPUTER AIDED DETECTION. , THIS PROCEDURE WAS A DIGITAL MAMMOGRAM.

## 2006-05-09 ENCOUNTER — Ambulatory Visit (HOSPITAL_COMMUNITY): Admission: RE | Admit: 2006-05-09 | Discharge: 2006-05-09 | Payer: Self-pay | Admitting: Family Medicine

## 2006-05-09 IMAGING — US US PELVIS COMPLETE MODIFY
1 series · 13 of 21 positions shown · non-contrast
Comparison: Previous exam of [DATE].

CLINICAL DATA: History of right ovarian cyst.  Evaluate for stability.  
TRANSABDOMINAL AND TRANSVAGINAL PELVIC ULTRASOUND:
TECHNIQUE: Both transabdominal and transvaginal ultrasound examinations of the pelvis were performed including evaluation of the uterus, ovaries, adnexal regions, and pelvic cul-de-sac.

[Series 1: us pelvis complete modify · 0.32mm/px · 13 of 21 slices shown]
[im 1/21]
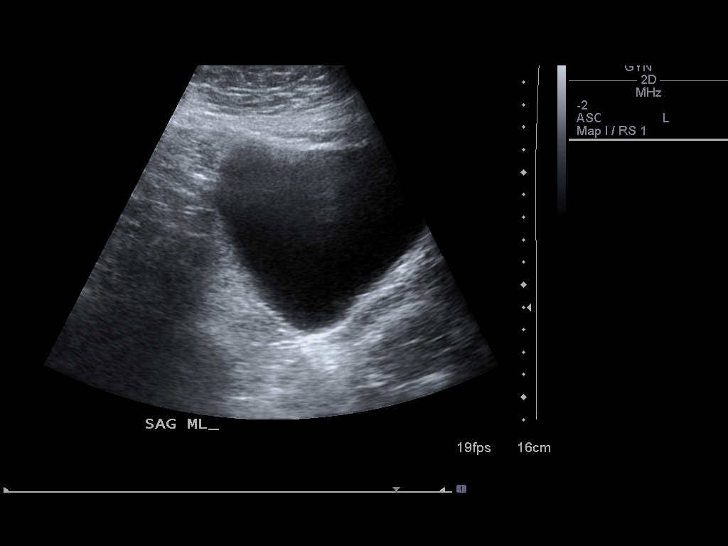
[im 3/21]
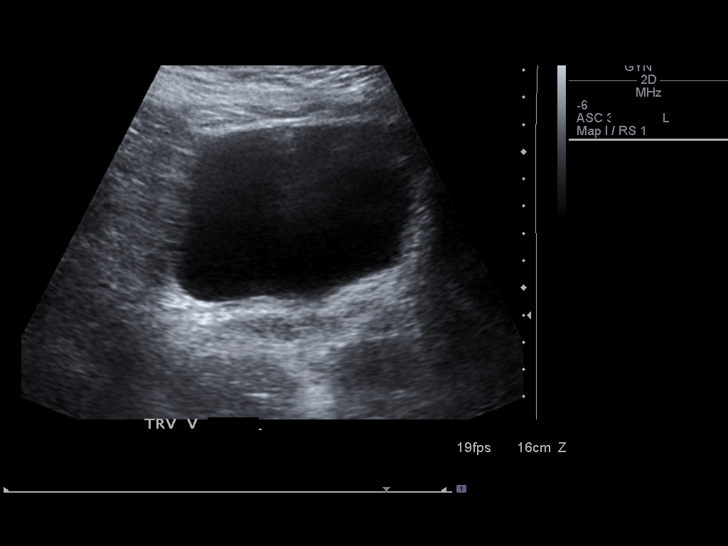
[im 5/21]
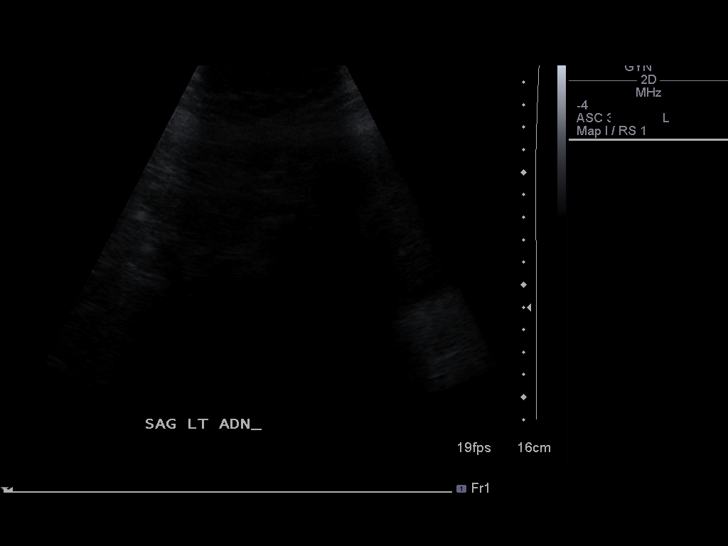
[im 6/21]
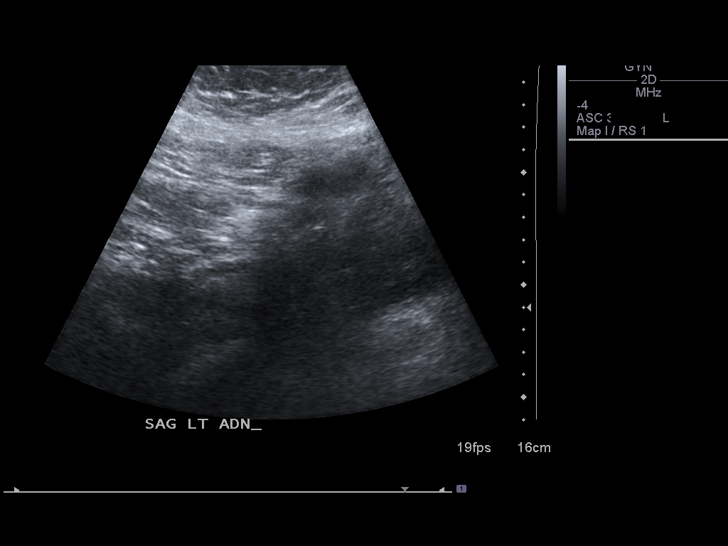
[im 8/21]
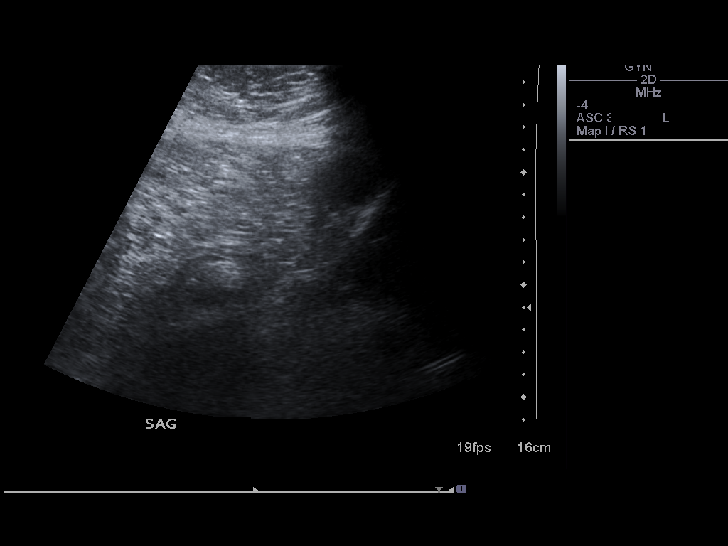
[im 9/21]
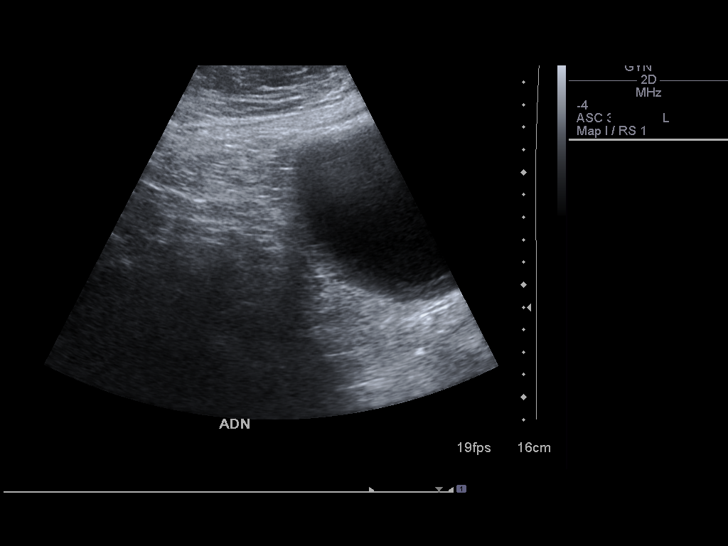
[im 11/21]
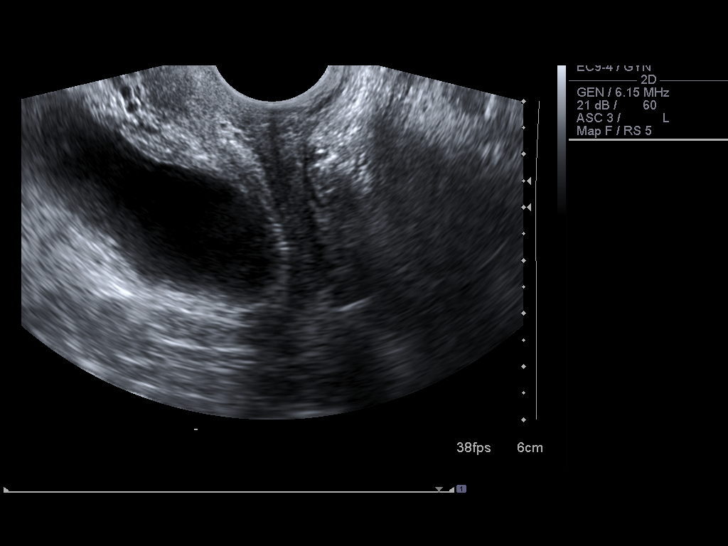
[im 13/21]
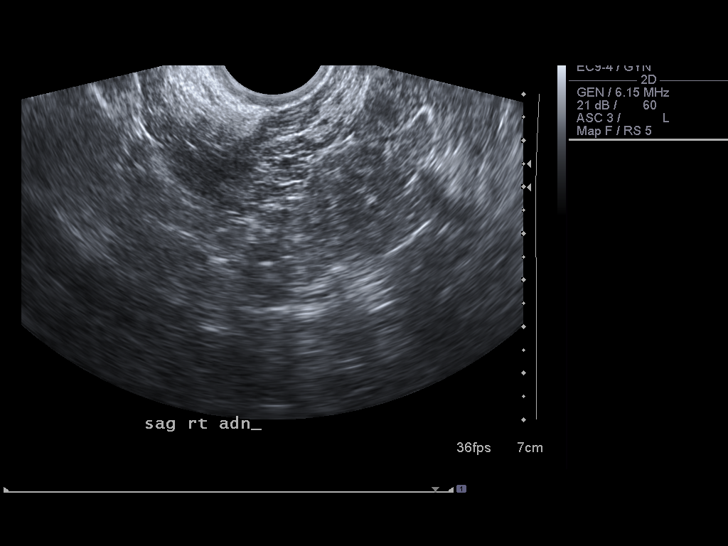
[im 14/21]
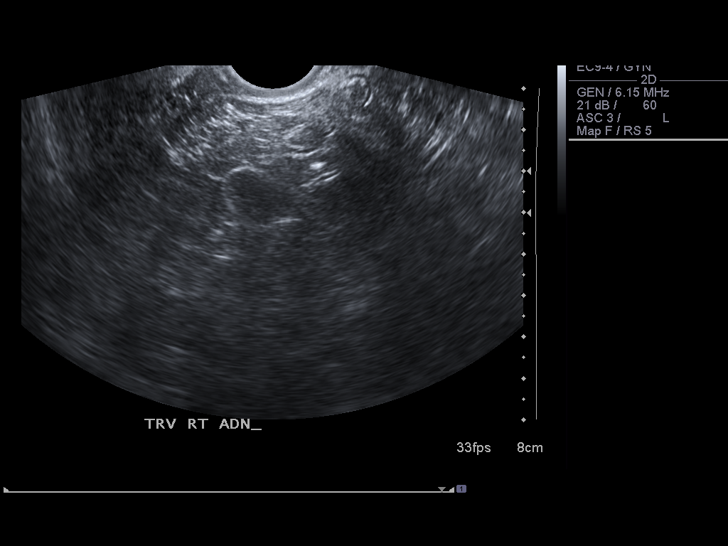
[im 16/21]
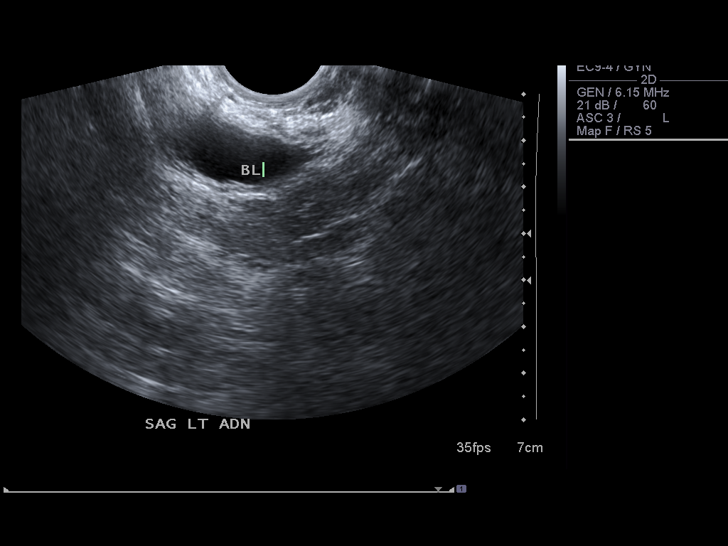
[im 17/21]
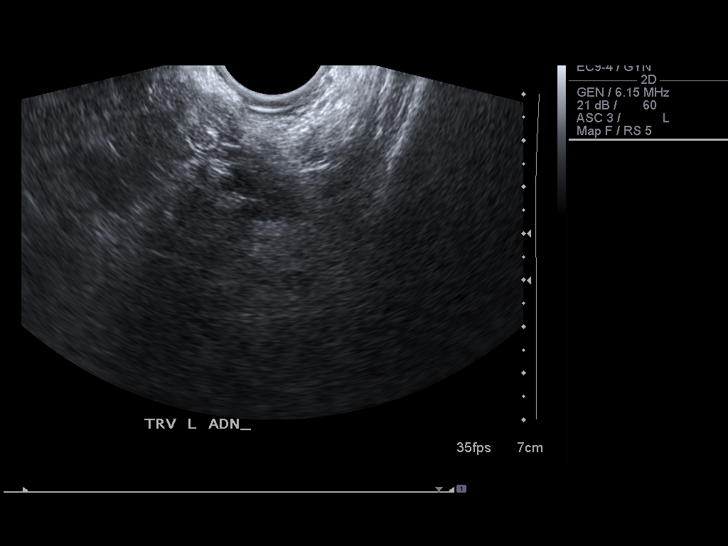
[im 19/21]
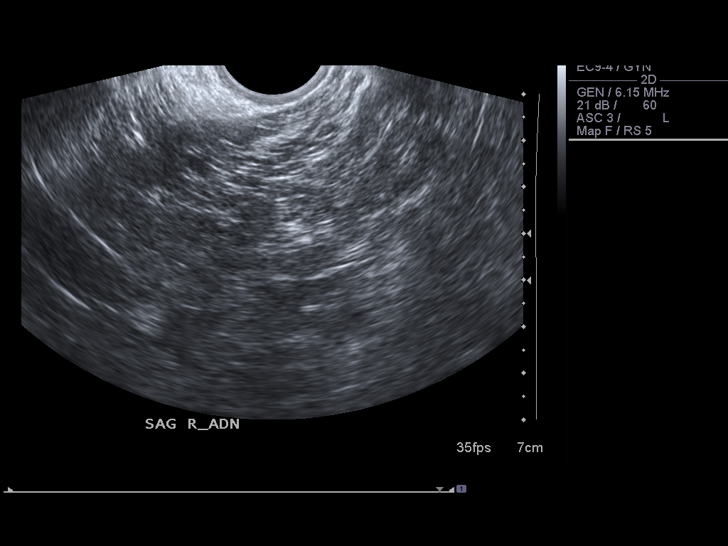
[im 21/21]
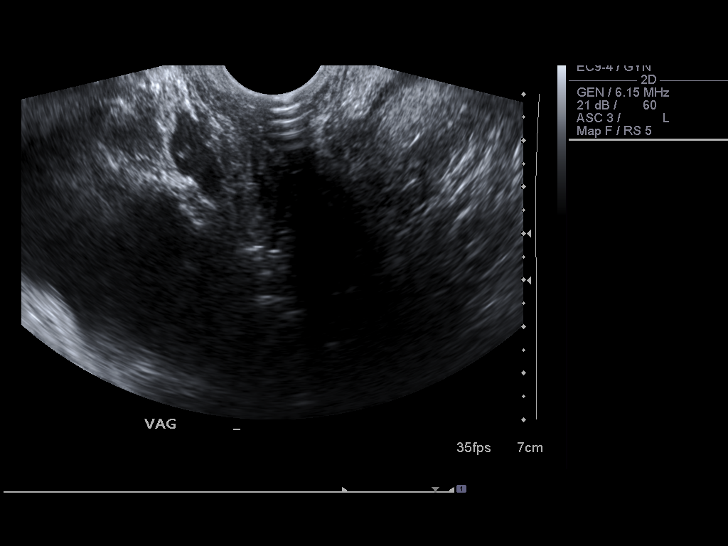

[13 of 21 positions shown; findings below may reference images not displayed]

FINDINGS: The patient is status post hysterectomy.  Both transabdominal and endovaginal approaches were obtained.  A normal vaginal cuff is identified endovaginally.  The ovaries could not be seen either transabdominally or endovaginally.  The ovary on the previous study was noted to be located immediately overlying the iliac vessels.  Evaluation in this area did not demonstrate any evidence for cystic abnormality, despite the nonvisualization of ovarian stroma.  This would suggest that there has been interval resolution of the right ovarian cyst since the previous exam.    No separate adnexal masses are noted.
IMPRESSION: Normal vaginal cuff.  Nonvisualized ovaries.  No evidence for right adnexal cystic abnormality is seen on today?s exam and given the size of the previous finding, I suspect that there may have been interval resolution or significant decrease in size of the right ovarian cyst since the previous examination.

## 2007-08-14 ENCOUNTER — Encounter: Admission: RE | Admit: 2007-08-14 | Discharge: 2007-08-14 | Payer: Self-pay | Admitting: Family Medicine

## 2007-08-14 IMAGING — MG MM SCREEN MAMMOGRAM BILATERAL
4 series · 4 of 4 positions shown · non-contrast
Comparison: none

DG SCREEN MAMMOGRAM BILATERAL
Bilateral CC and MLO view(s) were taken.

DIGITAL SCREENING MAMMOGRAM WITH CAD:
There are scattered fibroglandular densities.  No masses or malignant type calcifications are 
identified.  Compared with prior studies.

[R CC]
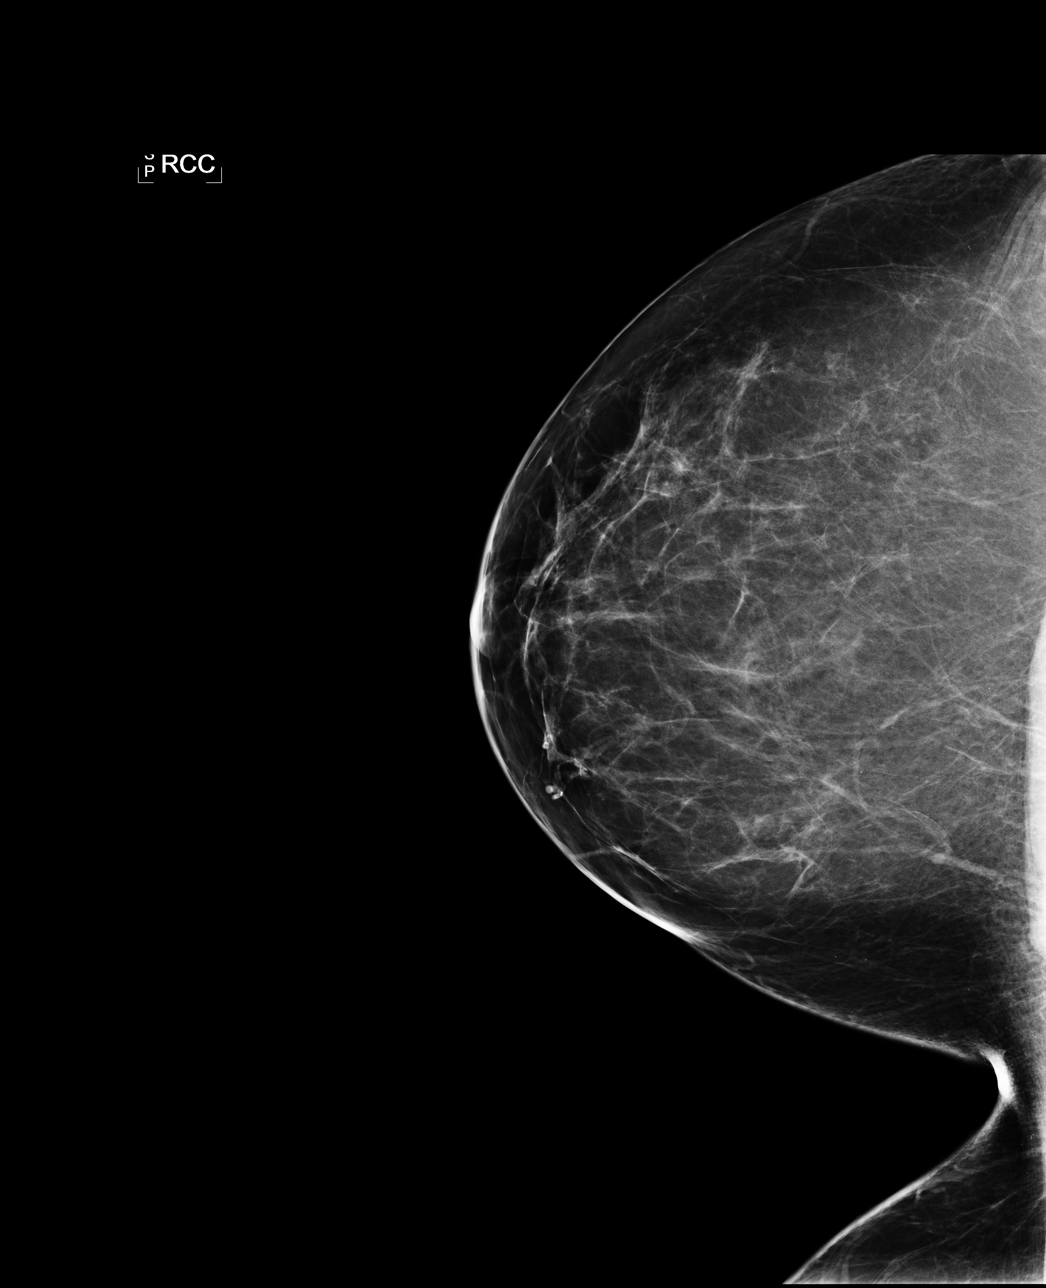

[L CC]
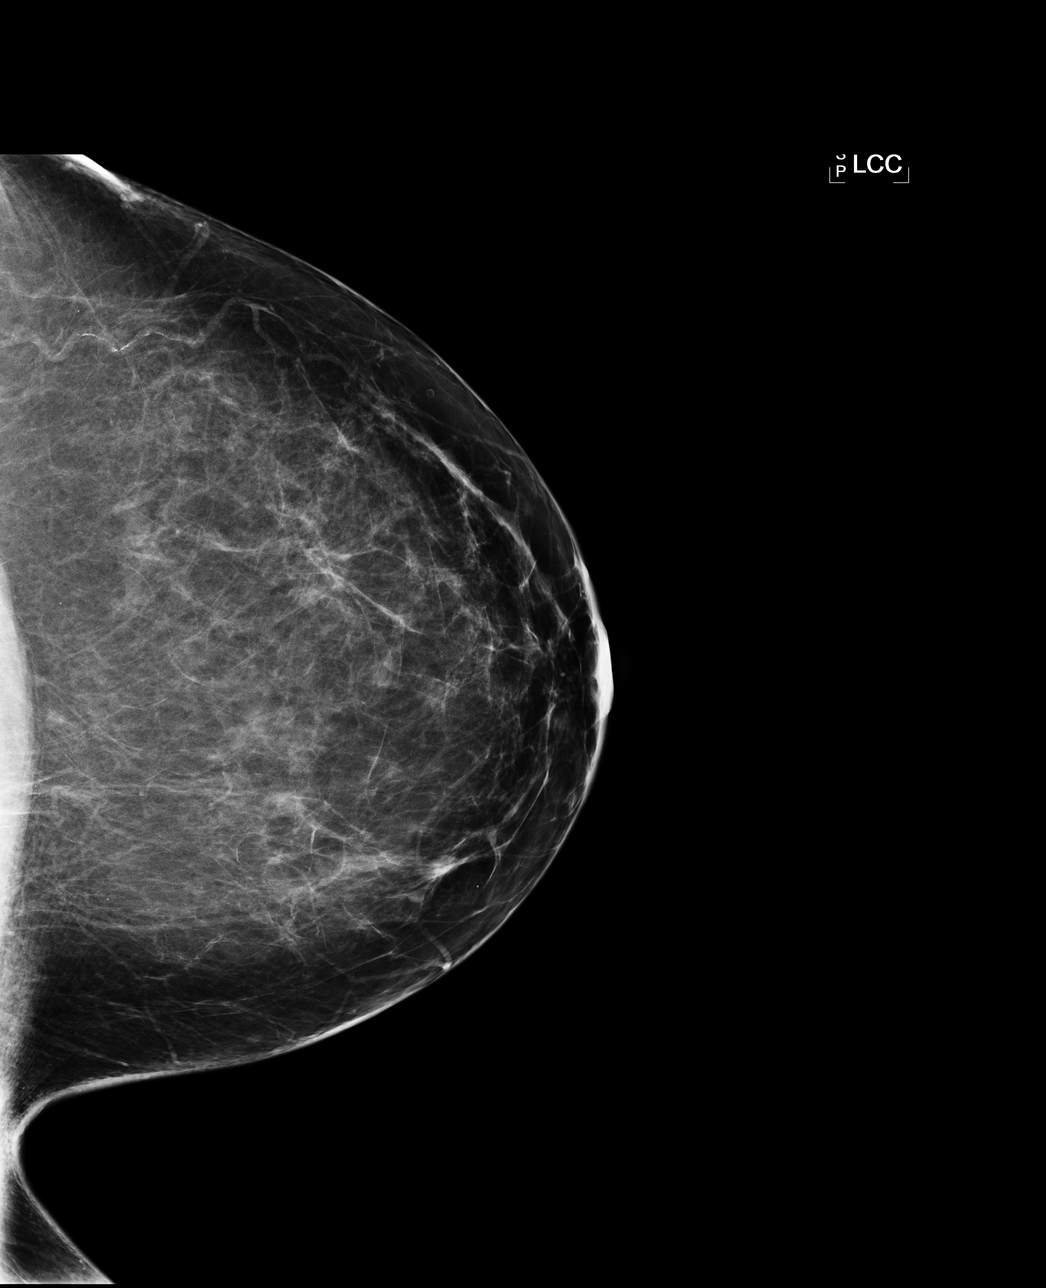

[L MLO]
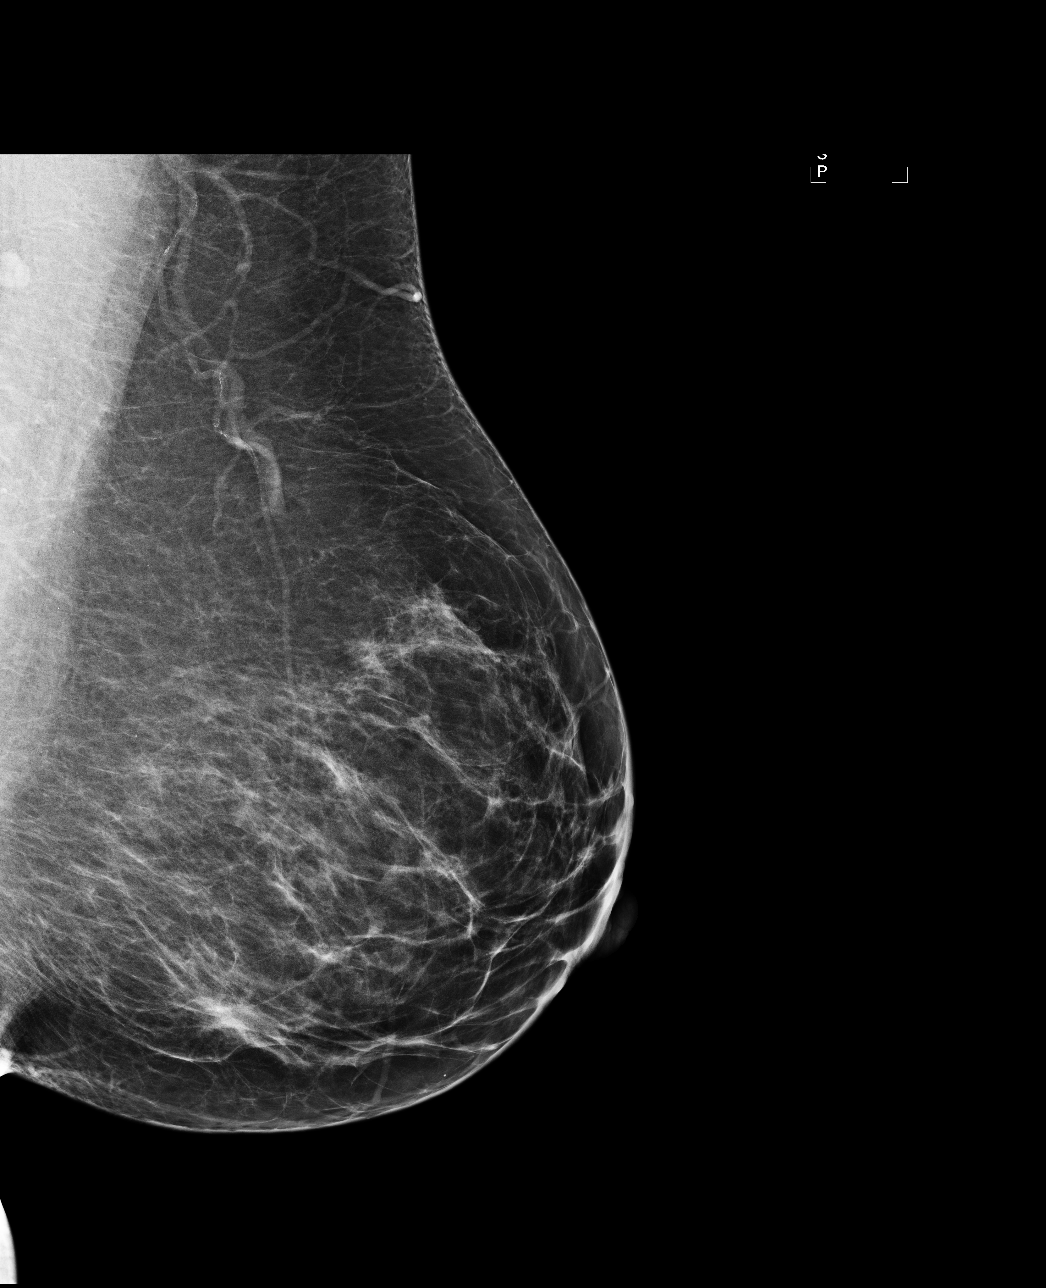

[R MLO]
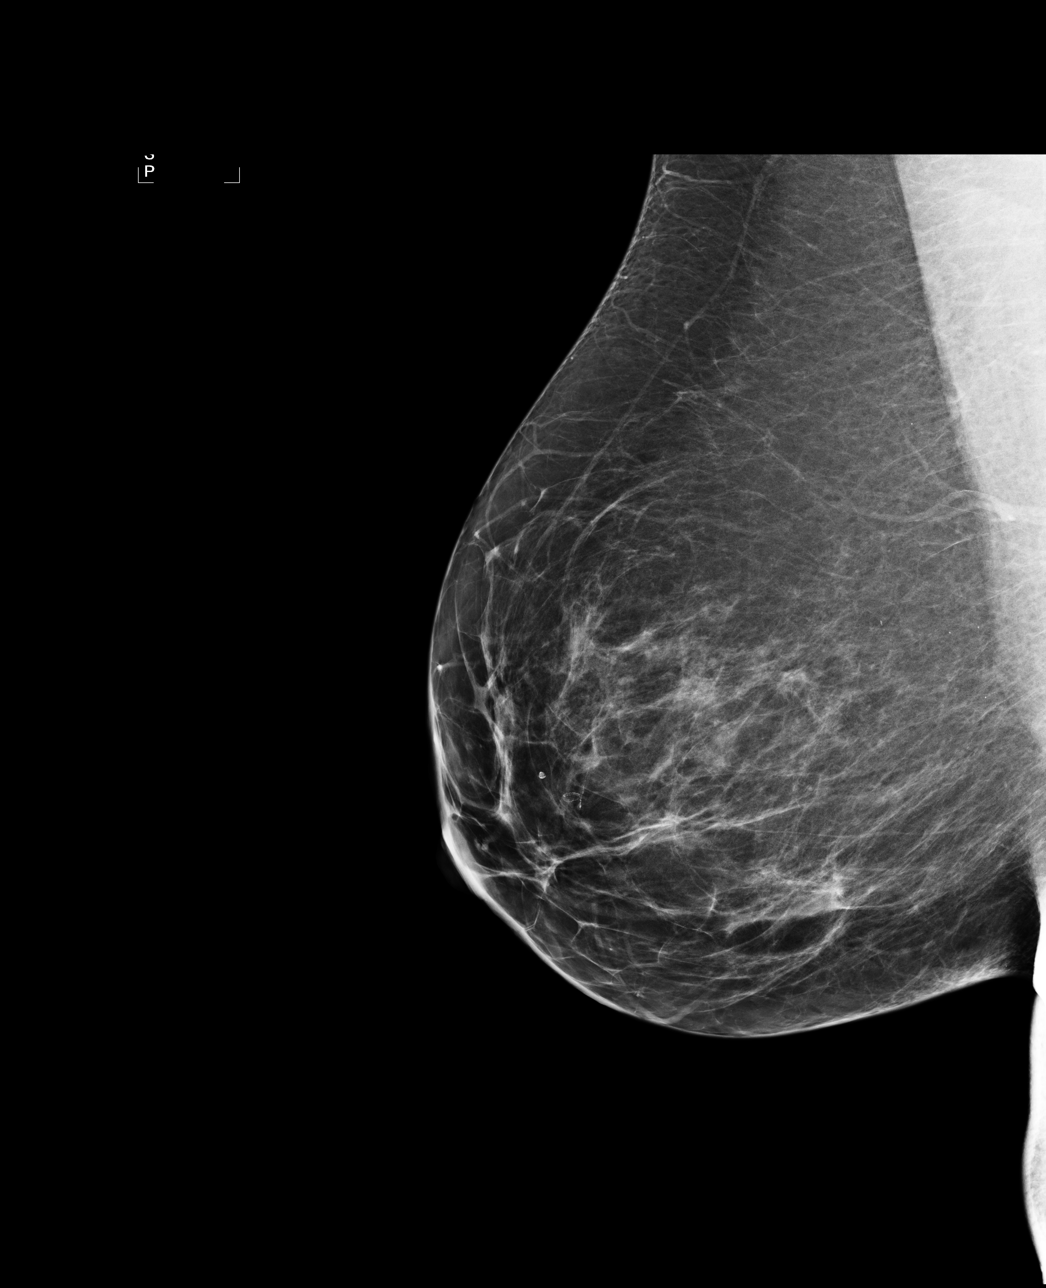

[4 of 4 positions shown; findings below may reference images not displayed]

IMPRESSION: No specific mammographic evidence of malignancy.  Next screening mammogram is recommended in one 
year.

ASSESSMENT: Negative - BI-RADS 1

Screening mammogram in 1 year.
ANALYZED BY COMPUTER AIDED DETECTION. , THIS PROCEDURE WAS A DIGITAL MAMMOGRAM.

## 2007-10-02 ENCOUNTER — Ambulatory Visit (HOSPITAL_COMMUNITY): Admission: RE | Admit: 2007-10-02 | Discharge: 2007-10-02 | Payer: Self-pay | Admitting: Family Medicine

## 2007-10-02 IMAGING — US US TRANSVAGINAL NON-OB
1 series · 13 of 25 positions shown · non-contrast
Comparison: Pelvic ultrasounds from [DATE] and [DATE]

CLINICAL DATA: Post menopausal.  Right adnexal fullness

TRANSABDOMINAL AND TRANSVAGINAL ULTRASOUND OF PELVIS
TECHNIQUE: Both transabdominal and transvaginal ultrasound
examinations of the pelvis were performed including evaluation of
the uterus, ovaries, adnexal regions, and pelvic cul-de-sac.

[Series 1: us transvaginal non-ob · 0.30mm/px · 13 of 32 slices shown]
[im 1/32]
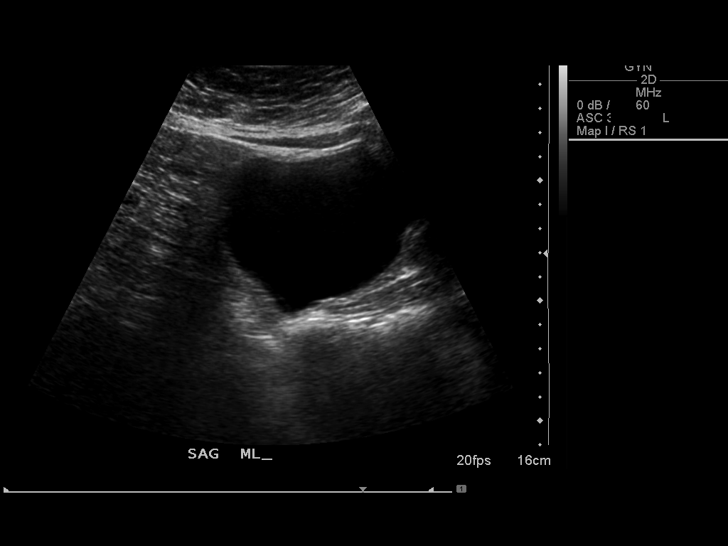
[im 3/32]
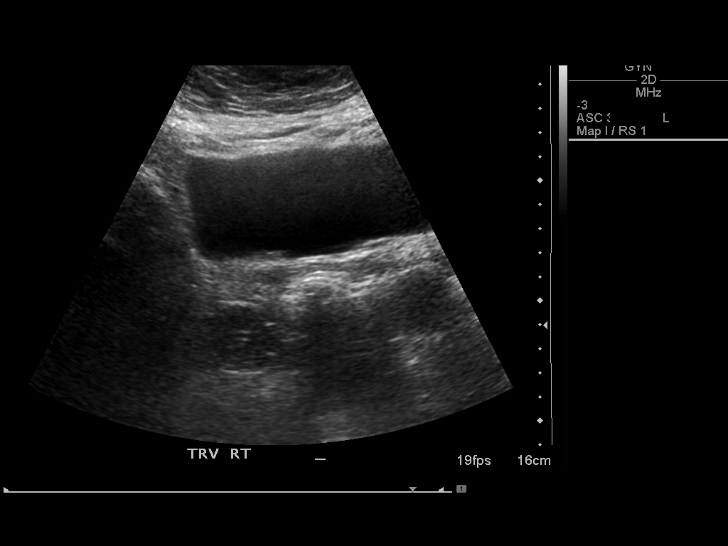
[im 6/32]
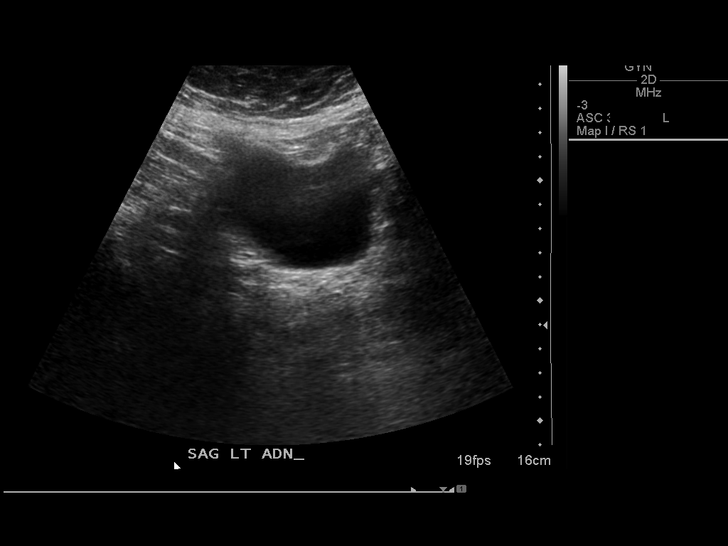
[im 8/32]
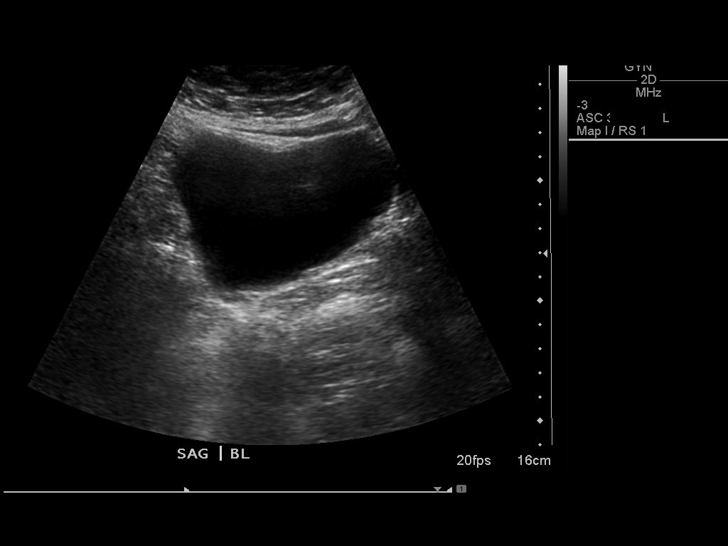
[im 11/32]
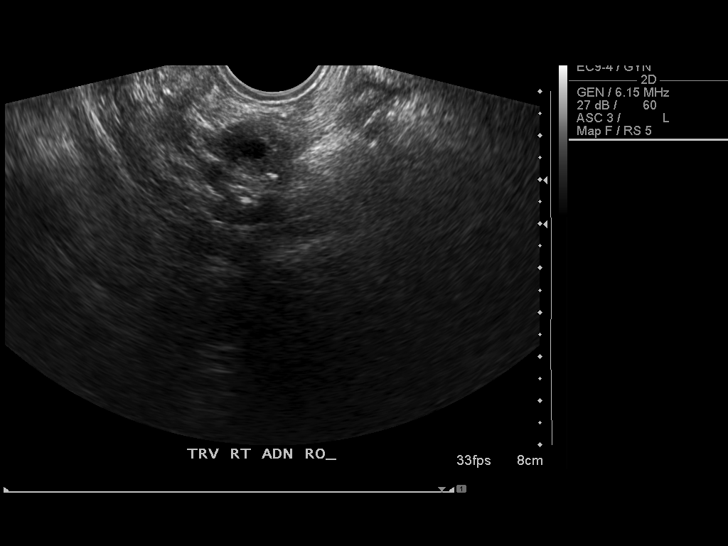
[im 13/32]
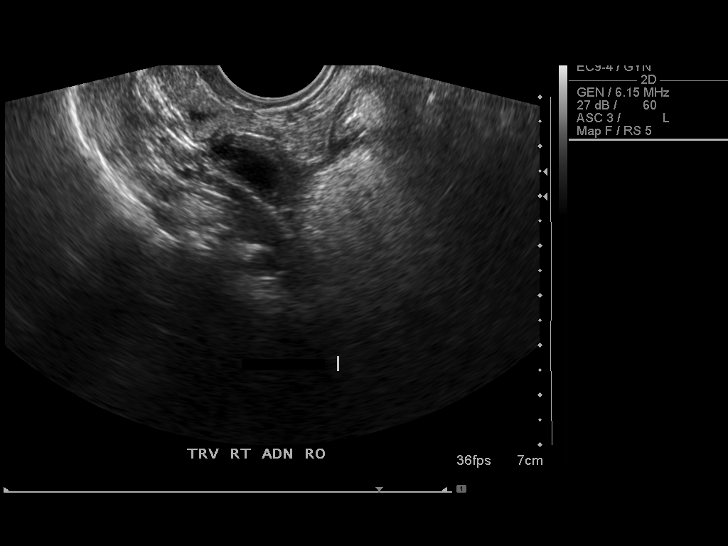
[im 16/32]
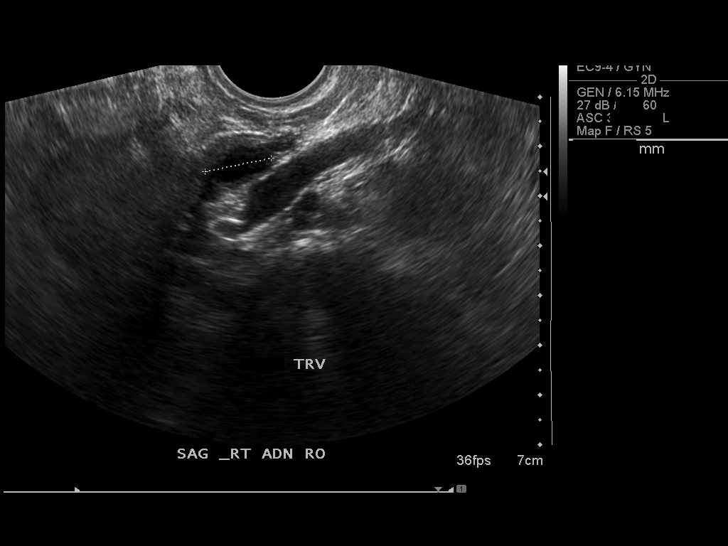
[im 19/32]
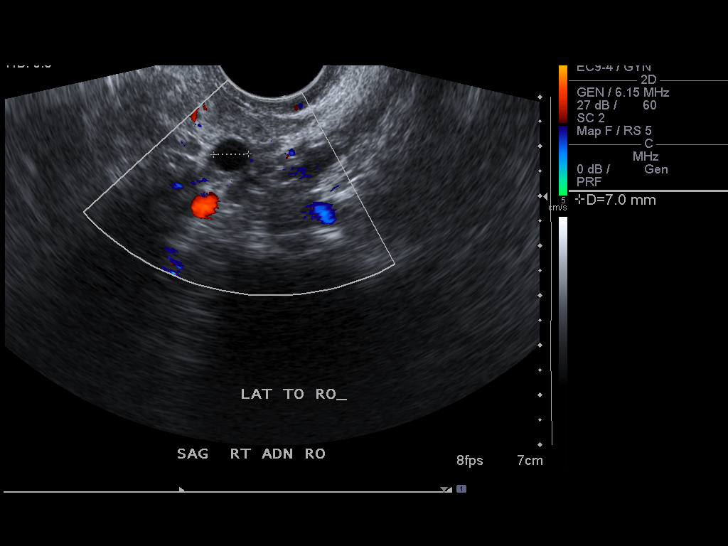
[im 21/32]
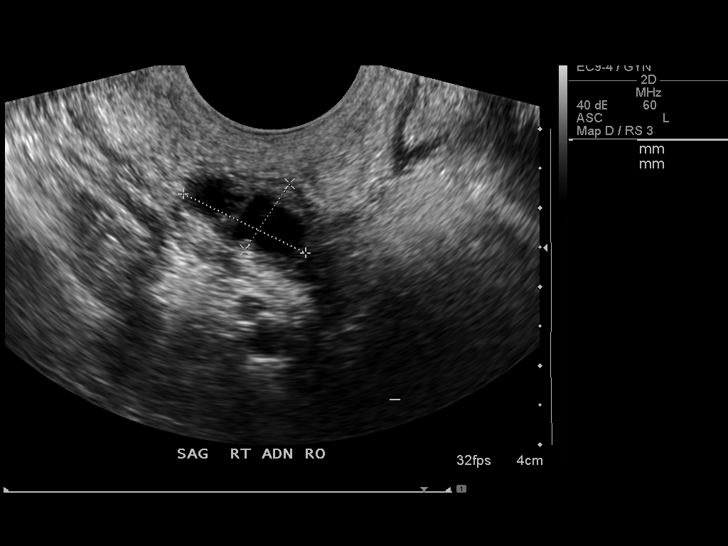
[im 24/32]
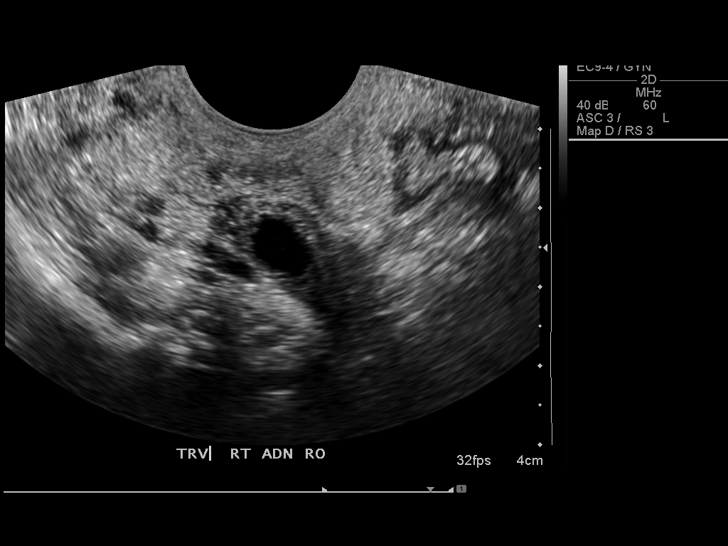
[im 26/32]
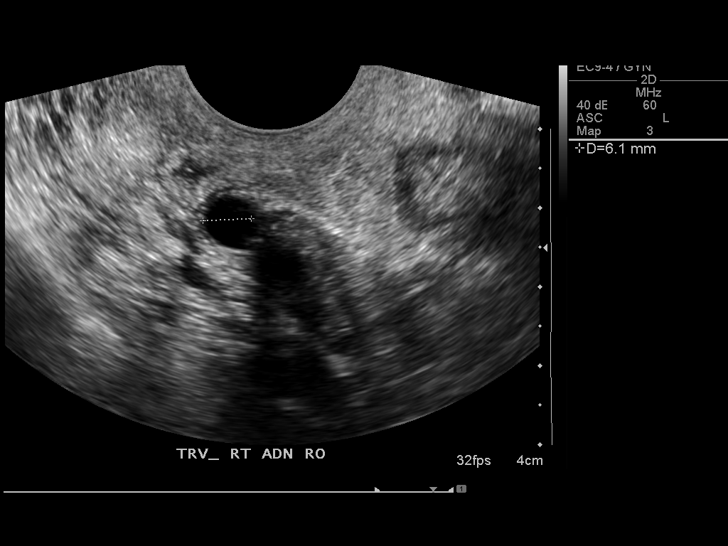
[im 29/32]
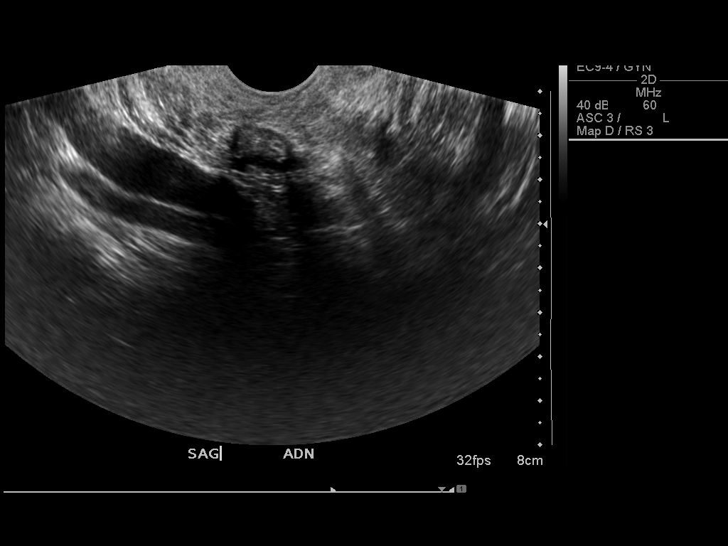
[im 32/32]
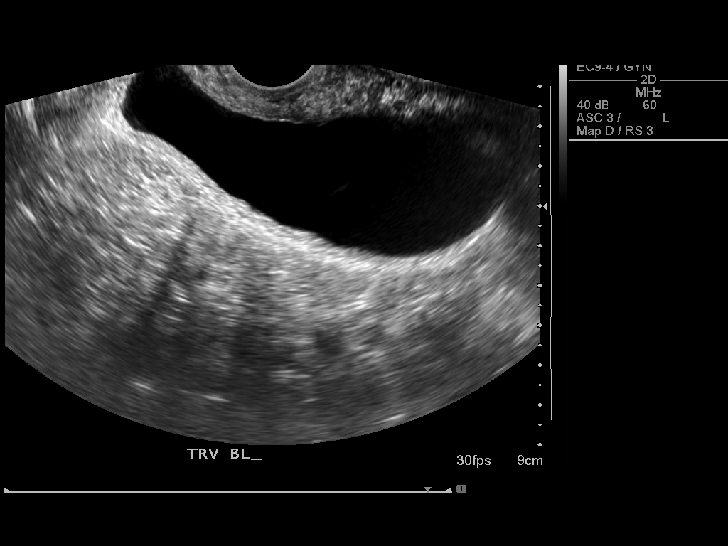

[13 of 25 positions shown; findings below may reference images not displayed]

FINDINGS: A normal vaginal cuff is noted.

The left ovary could not be seen with confidence either
transabdominally or endovaginally.  The right ovary is seen and has
an overall measurement of 1.9 x 1.0 x 1.4 cm.  This contains two
unilocular simple cysts the larger measuring 7.3 x 6.5 x 6.3 mm and
the smallest measuring 5.3 x 6.1 by 5.7 mm.  The right ovary was
not visualized on the most recent ultrasound.  On the ultrasound
from [5C], two simple cysts were identified.  The smaller cyst on
that exam is stable in size.  The larger cyst on that exam has
decreased in size on today's exam.  These both have an appearance
of benign postmenopausal cysts and lack of interval increase in
size since [5C] would correlate with these representing a benign
process.  6-month follow-up is recommended for continued short-term
reassessment and to confirm stability over 3 years..
IMPRESSION: To the right simple ovarian cysts measuring less than 1 cm in size.
One is stable since [5C] and the second is smaller suggesting these
are both benign in etiology.  6-month follow-up is recommended for
short-term reassessment.  Please see above report for full
discussion.

## 2008-04-05 ENCOUNTER — Ambulatory Visit (HOSPITAL_COMMUNITY): Admission: RE | Admit: 2008-04-05 | Discharge: 2008-04-05 | Payer: Self-pay | Admitting: Family Medicine

## 2008-04-05 IMAGING — US US TRANSVAGINAL NON-OB
1 series · 14 of 25 positions shown · non-contrast
Comparison: [DATE] and earlier studies dating back to
[DATE]

CLINICAL DATA: Post menopausal female.  Follow-up right ovarian
cysts.

TRANSVAGINAL ULTRASOUND OF PELVIS
TECHNIQUE: Transvaginal ultrasound examination of the pelvis was
performed including evaluation of the uterus, ovaries, adnexal
regions, and pelvic cul-de-sac.

[Series 1: us transvaginal non-ob · 0.09mm/px · 14 of 26 slices shown]
[im 1/26]
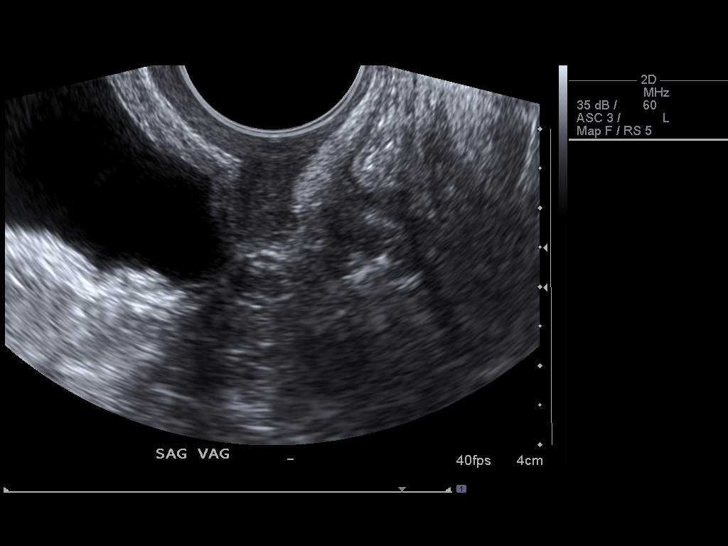
[im 3/26]
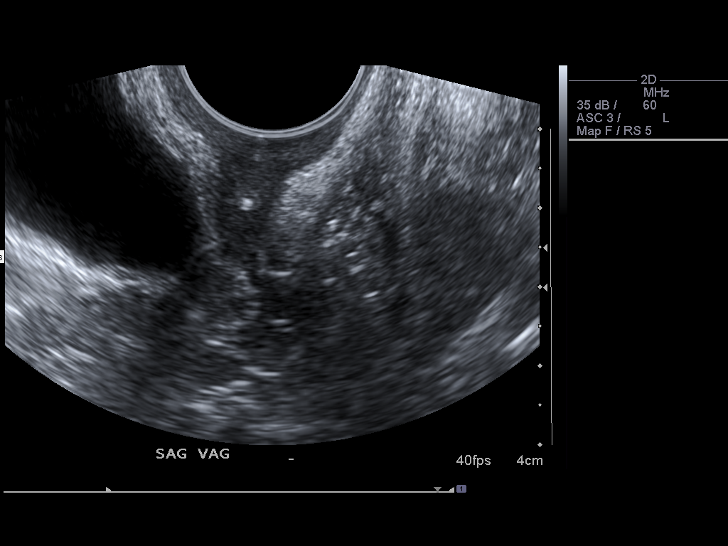
[im 5/26]
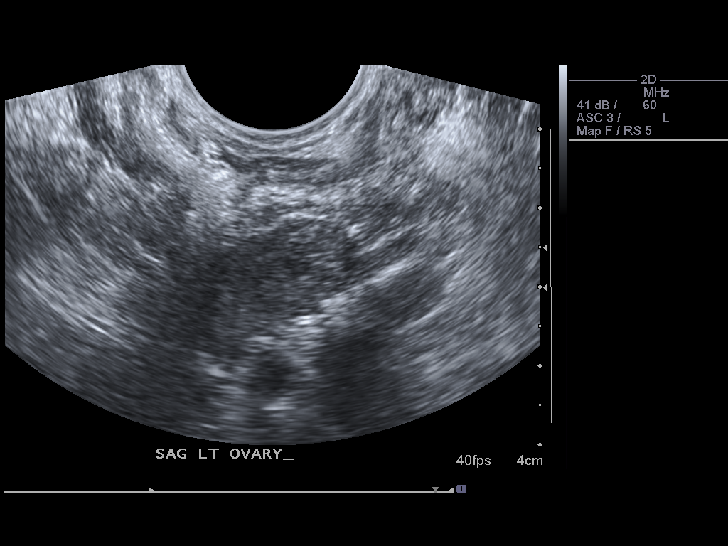
[im 7/26]
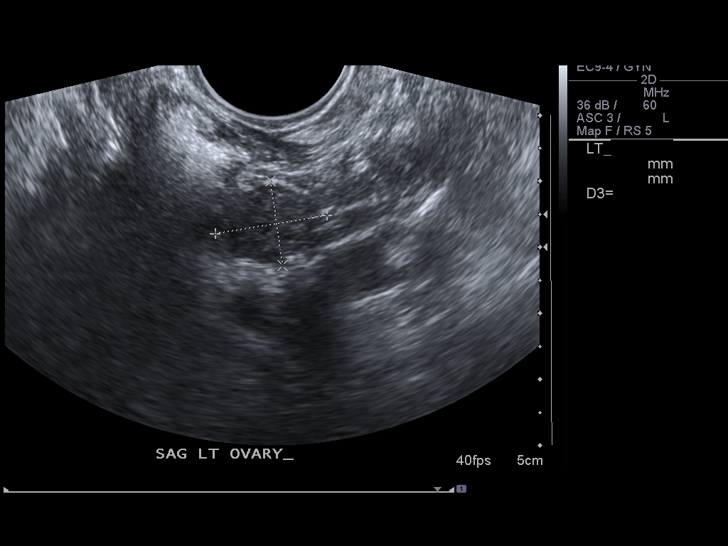
[im 9/26]
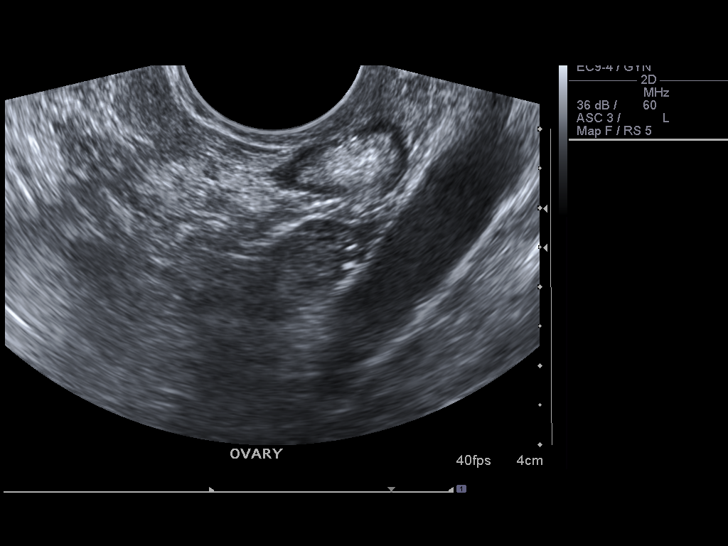
[im 10/26]
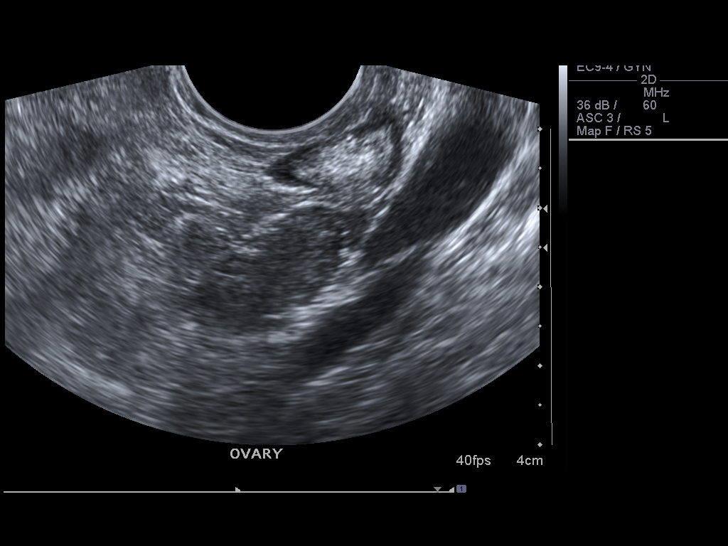
[im 12/26]
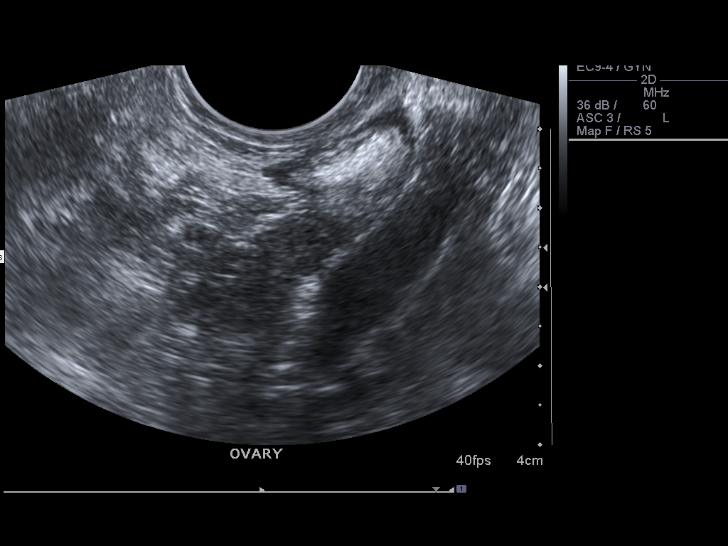
[im 14/26]
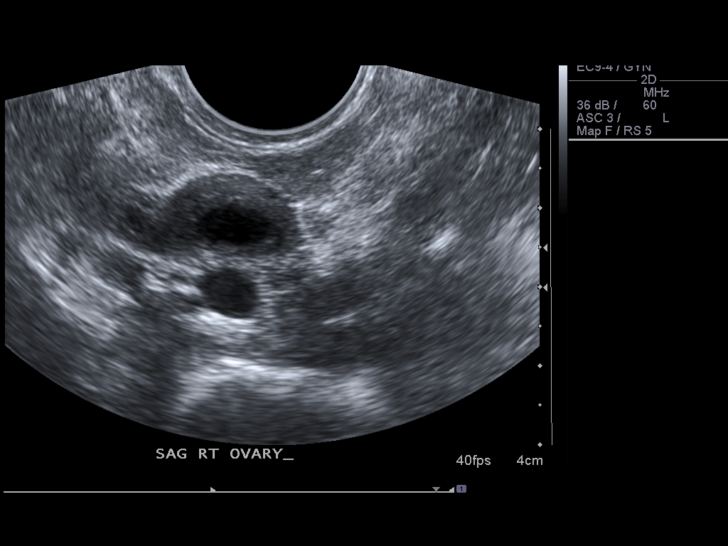
[im 16/26]
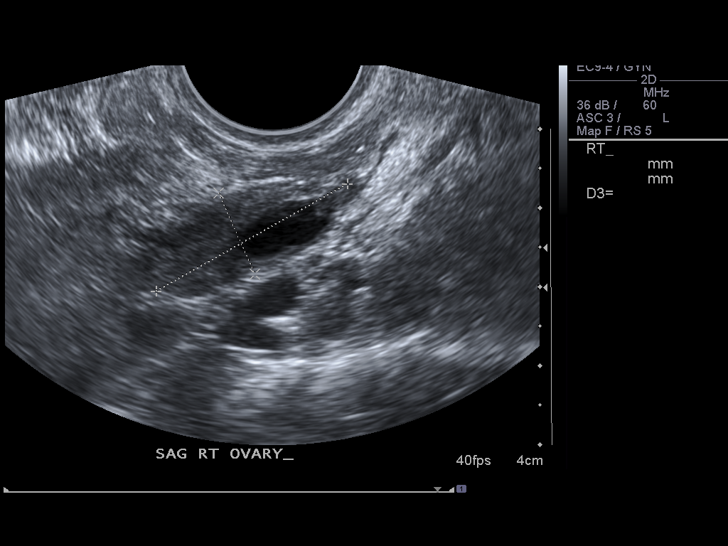
[im 17/26]
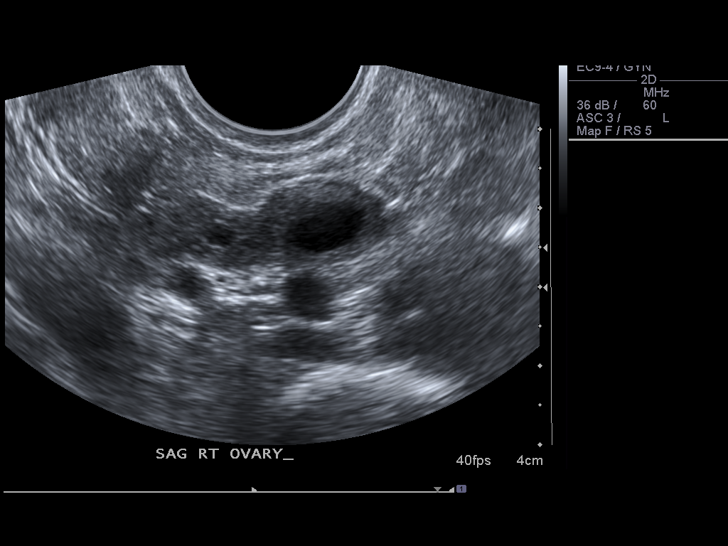
[im 19/26]
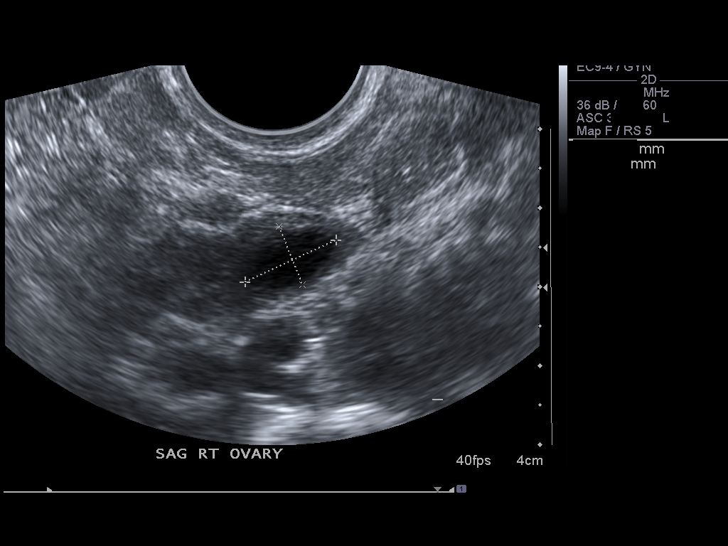
[im 21/26]
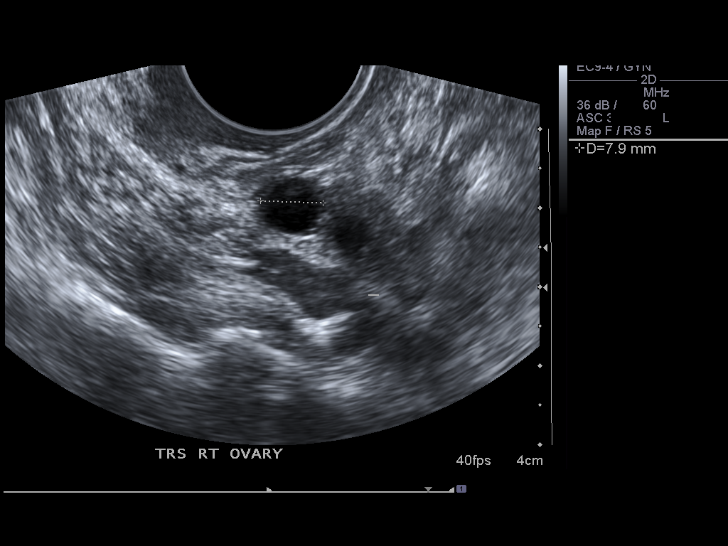
[im 23/26]
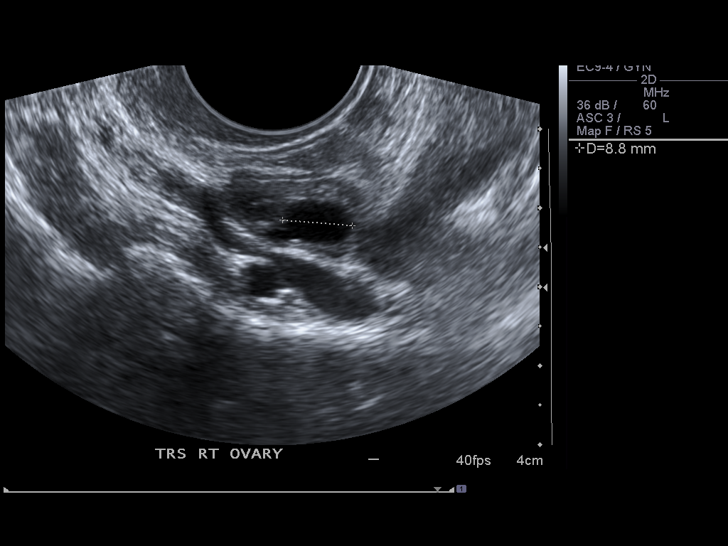
[im 26/26]
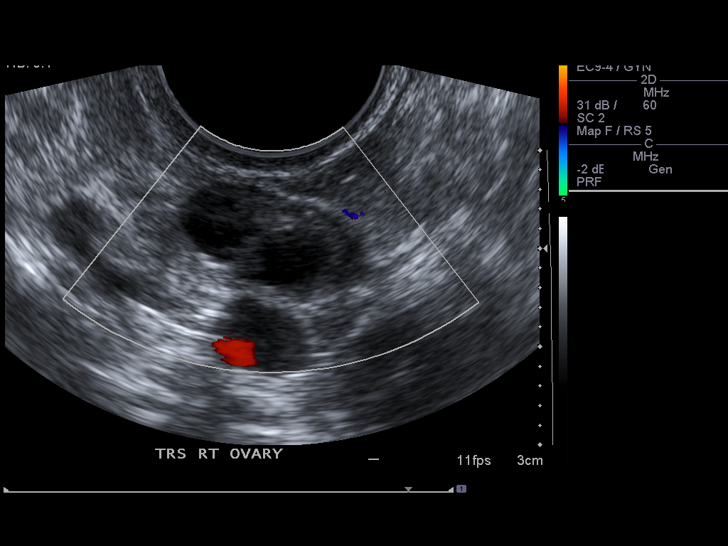

[14 of 25 positions shown; findings below may reference images not displayed]

FINDINGS: The uterus is surgically absent.  Vaginal cuff is
unremarkable appearance.

The left ovary is normal appearance.

The right ovary contains two small simple cysts each measuring less
than 1 cm.  These have benign sonographic features, and are stable
decreased in size compared to previous exams dating back to
[DATE].
IMPRESSION: 1.  Two small less than 1 cm simple cysts in the right ovary.
These have a benign sonographic features, and are stable or
decreased in size compared to several previous studies dating to
[DATE].
2.  Normal left ovary.
3.  Previous hysterectomy.

## 2010-04-16 HISTORY — PX: BUNIONECTOMY: SHX129

## 2010-05-07 ENCOUNTER — Encounter: Payer: Self-pay | Admitting: Family Medicine

## 2011-06-06 ENCOUNTER — Encounter: Payer: Self-pay | Admitting: Gastroenterology

## 2011-06-13 ENCOUNTER — Encounter: Payer: Self-pay | Admitting: Gastroenterology

## 2011-07-16 DIAGNOSIS — D126 Benign neoplasm of colon, unspecified: Secondary | ICD-10-CM

## 2011-07-16 HISTORY — DX: Benign neoplasm of colon, unspecified: D12.6

## 2011-07-18 ENCOUNTER — Encounter: Payer: Self-pay | Admitting: Gastroenterology

## 2011-07-18 ENCOUNTER — Ambulatory Visit (AMBULATORY_SURGERY_CENTER): Payer: 59

## 2011-07-18 VITALS — Ht 62.0 in | Wt 147.2 lb

## 2011-07-18 DIAGNOSIS — Z800 Family history of malignant neoplasm of digestive organs: Secondary | ICD-10-CM

## 2011-07-18 DIAGNOSIS — I10 Essential (primary) hypertension: Secondary | ICD-10-CM | POA: Insufficient documentation

## 2011-07-18 DIAGNOSIS — E7800 Pure hypercholesterolemia, unspecified: Secondary | ICD-10-CM | POA: Insufficient documentation

## 2011-07-18 MED ORDER — PEG-KCL-NACL-NASULF-NA ASC-C 100 G PO SOLR: 1.0000 | Freq: Once | ORAL | Status: DC

## 2011-08-06 ENCOUNTER — Ambulatory Visit (AMBULATORY_SURGERY_CENTER): Payer: 59 | Admitting: Gastroenterology

## 2011-08-06 ENCOUNTER — Encounter: Payer: Self-pay | Admitting: Gastroenterology

## 2011-08-06 VITALS — BP 124/58 | HR 50 | Temp 97.0°F | Resp 20 | Ht 62.0 in | Wt 147.0 lb

## 2011-08-06 DIAGNOSIS — D126 Benign neoplasm of colon, unspecified: Secondary | ICD-10-CM

## 2011-08-06 DIAGNOSIS — Z800 Family history of malignant neoplasm of digestive organs: Secondary | ICD-10-CM

## 2011-08-06 DIAGNOSIS — Z1211 Encounter for screening for malignant neoplasm of colon: Secondary | ICD-10-CM

## 2011-08-06 MED ORDER — SODIUM CHLORIDE 0.9 % IV SOLN: 500.0000 mL | INTRAVENOUS | Status: DC

## 2011-08-06 NOTE — Patient Instructions (Signed)
YOU HAD AN ENDOSCOPIC PROCEDURE TODAY AT THE [facility]: Refer to the procedure report that was given to you for any specific questions about what was found during the examination.  If the procedure report does not answer your questions, please call your gastroenterologist to clarify.  If you requested that your care partner not be given the details of your procedure findings, then the procedure report has been included in a sealed envelope for you to review at your convenience later.  YOU SHOULD EXPECT: Some feelings of bloating in the abdomen. Passage of more gas than usual.  Walking can help get rid of the air that was put into your GI tract during the procedure and reduce the bloating. If you had a lower endoscopy (such as a colonoscopy or flexible sigmoidoscopy) you may notice spotting of blood in your stool or on the toilet paper. If you underwent a bowel prep for your procedure, then you may not have a normal bowel movement for a few days.  DIET: Your first meal following the procedure should be a light meal and then it is ok to progress to your normal diet.  A half-sandwich or bowl of soup is an example of a good first meal.  Heavy or fried foods are harder to digest and may make you feel nauseous or bloated.  Likewise meals heavy in dairy and vegetables can cause extra gas to form and this can also increase the bloating.  Drink plenty of fluids but you should avoid alcoholic beverages for 24 hours.  ACTIVITY: Your care partner should take you home directly after the procedure.  You should plan to take it easy, moving slowly for the rest of the day.  You can resume normal activity the day after the procedure however you should NOT DRIVE or use heavy machinery for 24 hours (because of the sedation medicines used during the test).    SYMPTOMS TO REPORT IMMEDIATELY: A gastroenterologist can be reached at any hour.  During normal business hours, 8:30 AM to 5:00 PM Monday through Friday,  call [phone].  After hours and on weekends, please call the GI answering service at [phone] who will take a message and have the physician on call contact you.   Following lower endoscopy (colonoscopy or flexible sigmoidoscopy):  Excessive amounts of blood in the stool  Significant tenderness or worsening of abdominal pains  Swelling of the abdomen that is new, acute  Fever of 100F or higher YOU HAD AN ENDOSCOPIC PROCEDURE TODAY AT THE [facility]: Refer to the procedure report that was given to you for any specific questions about what was found during the examination.  If the procedure report does not answer your questions, please call your gastroenterologist to clarify.  If you requested that your care partner not be given the details of your procedure findings, then the procedure report has been included in a sealed envelope for you to review at your convenience later.  YOU SHOULD EXPECT: Some feelings of bloating in the abdomen. Passage of more gas than usual.  Walking can help get rid of the air that was put into your GI tract during the procedure and reduce the bloating. If you had a lower endoscopy (such as a colonoscopy or flexible sigmoidoscopy) you may notice spotting of blood in your stool or on the toilet paper. If you underwent a bowel prep for your procedure, then you may not have a normal bowel movement for a few days.  DIET:   Your first meal following the procedure should be a light meal and then it is ok to progress to your normal diet.  A half-sandwich or bowl of soup is an example of a good first meal.  Heavy or fried foods are harder to digest and may make you feel nauseous or bloated.  Likewise meals heavy in dairy and vegetables can cause extra gas to form and this can also increase the bloating.  Drink plenty of fluids but you should avoid alcoholic beverages for 24 hours.  ACTIVITY: Your care partner should take you home directly after the procedure.   You should plan to take it easy, moving slowly for the rest of the day.  You can resume normal activity the day after the procedure however you should NOT DRIVE or use heavy machinery for 24 hours (because of the sedation medicines used during the test).    SYMPTOMS TO REPORT IMMEDIATELY: A gastroenterologist can be reached at any hour.  During normal business hours, 8:30 AM to 5:00 PM Monday through Friday, call [phone].  After hours and on weekends, please call the GI answering service at [phone] who will take a message and have the physician on call contact you.   Following lower endoscopy (colonoscopy or flexible sigmoidoscopy):  Excessive amounts of blood in the stool  Significant tenderness or worsening of abdominal pains  Swelling of the abdomen that is new, acute  Fever of 100F or higher  Following upper endoscopy (EGD)  Vomiting of blood or coffee ground material  New chest pain or pain under the shoulder blades  Painful or persistently difficult swallowing  New shortness of breath  Fever of 100F or higher  Black, tarry-looking stools  FOLLOW UP: If any biopsies were taken you will be contacted by phone or by letter within the next 1-3 weeks.  Call your gastroenterologist if you have not heard about the biopsies in 3 weeks.  Our staff will call the home number listed on your records the next business day following your procedure to check on you and address any questions or concerns that you may have at that time regarding the information given to you following your procedure. This is a courtesy call and so if there is no answer at the home number and we have not heard from you through the emergency physician on call, we will assume that you have returned to your regular daily activities without incident.  SIGNATURES/CONFIDENTIALITY: You and/or your care partner have signed paperwork which will be entered into your electronic medical record.  These signatures attest  to the fact that that the information above on your After Visit Summary has been reviewed and is understood.  Full responsibility of the confidentiality of this discharge information lies with you and/or your care-partner.  FOLLOW UP: If any biopsies were taken you will be contacted by phone or by letter within the next 1-3 weeks.  Call your gastroenterologist if you have not heard about the biopsies in 3 weeks.  Our staff will call the home number listed on your records the next business day following your procedure to check on you and address any questions or concerns that you may have at that time regarding the information given to you following your procedure. This is a courtesy call and so if there is no answer at the home number and we have not heard from you through the emergency physician on call, we will assume that you have returned to your regular daily   activities without incident.  SIGNATURES/CONFIDENTIALITY: You and/or your care partner have signed paperwork which will be entered into your electronic medical record.  These signatures attest to the fact that that the information above on your After Visit Summary has been reviewed and is understood.  Full responsibility of the confidentiality of this discharge information lies with you and/or your care-partner.  

## 2011-08-06 NOTE — Progress Notes (Signed)
Patient did not have preoperative order for IV antibiotic SSI prophylaxis. ([id number])  Patient did not experience any of the following events: a burn prior to discharge; a fall within the facility; wrong site/side/patient/procedure/implant event; or a hospital transfer or hospital admission upon discharge from the facility. ([id number])  

## 2011-08-06 NOTE — Op Note (Signed)
[facility] [address] [place], [place]  [address]  COLONOSCOPY PROCEDURE REPORT PATIENT:  Bridget Phelps, Bridget Phelps  MR#:  [id number] BIRTHDATE:  Jul 02, 1950, 60 yrs. old  GENDER:  female ENDOSCOPIST:  Judie Petit T. Russella Dar, MD, [facility]  PROCEDURE DATE:  08/06/2011 PROCEDURE:  Colonoscopy with biopsy and snare polypectomy ASA CLASS:  Class II INDICATIONS:  1) Elevated Risk Screening  2) family history of colon cancer: MGGM, maternal aunt, paternal cousin MEDICATIONS:   These medications were titrated to patient response per physician's verbal order, Fentanyl 100 mcg IV, Versed 10 mg IV DESCRIPTION OF PROCEDURE:   After the risks benefits and alternatives of the procedure were thoroughly explained, informed consent was obtained.  Digital rectal exam was performed and revealed no abnormalities.   The LB [id number]-[id number] endoscope was introduced through the anus and advanced to the cecum, which was identified by both the appendix and ileocecal valve, without limitations.  The quality of the prep was excellent, using MoviPrep.  The instrument was then slowly withdrawn as the colon was fully examined. <<PROCEDUREIMAGES>> FINDINGS:  A sessile polyp was found in the cecum. It was 7 mm in size. Polyp was snared without cautery. Retrieval was successful. A sessile polyp was found in the ascending colon. It was 4 mm in size. The polyp was removed using cold biopsy forceps.  A sessile polyp was found in the sigmoid colon. It was 5 mm in size. Polyp was snared without cautery. Retrieval was successful.  Mild diverticulosis was found in the sigmoid colon.  Otherwise normal colonoscopy without other polyps, masses, vascular ectasias, or inflammatory changes.  Retroflexed views in the rectum revealed no abnormalities.  The time to cecum =  4.25 minutes. The scope was then withdrawn (time =  12 min) from the patient and the procedure completed.  COMPLICATIONS:  None  ENDOSCOPIC IMPRESSION: 1) 7 mm  sessile polyp in the cecum 2) 4 mm sessile polyp in the ascending colon 3) 5 mm sessile polyp in the sigmoid colon 4) Mild diverticulosis in the sigmoid colon  RECOMMENDATIONS: 1) Await pathology results 2) High fiber diet with liberal fluid intake. 3) Repeat Colonoscopy in 5 years.  [name]. Russella Dar, MD, [facility]  CC:  Halina Maidens, MD  n. Rosalie DoctorVenita Lick. Capone Schwinn at 08/06/2011 11:58 AM  [place], [place], [phone]

## 2011-08-07 ENCOUNTER — Telehealth: Payer: Self-pay

## 2011-08-07 NOTE — Telephone Encounter (Signed)
  Follow up Call-  Call back number 08/06/2011  Post procedure Call Back phone  # [phone]  Permission to leave phone message Yes     Patient questions:  Do you have a fever, pain , or abdominal swelling? no Pain Score  0 *  Have you tolerated food without any problems? yes  Have you been able to return to your normal activities? yes  Do you have any questions about your discharge instructions: Diet   no Medications  no Follow up visit  no  Do you have questions or concerns about your Care? no  Actions: * If pain score is 4 or above: No action needed, pain <4.

## 2011-08-09 ENCOUNTER — Encounter: Payer: Self-pay | Admitting: Gastroenterology

## 2013-03-16 ENCOUNTER — Ambulatory Visit: Payer: 59 | Admitting: Gastroenterology

## 2013-03-26 ENCOUNTER — Ambulatory Visit (INDEPENDENT_AMBULATORY_CARE_PROVIDER_SITE_OTHER): Payer: 59 | Admitting: Gastroenterology

## 2013-03-26 ENCOUNTER — Encounter: Payer: Self-pay | Admitting: Gastroenterology

## 2013-03-26 VITALS — BP 116/72 | HR 68 | Ht 62.25 in | Wt 167.0 lb

## 2013-03-26 DIAGNOSIS — R1319 Other dysphagia: Secondary | ICD-10-CM

## 2013-03-26 DIAGNOSIS — K219 Gastro-esophageal reflux disease without esophagitis: Secondary | ICD-10-CM

## 2013-03-26 MED ORDER — OMEPRAZOLE 20 MG PO CPDR: 20.0000 mg | DELAYED_RELEASE_CAPSULE | Freq: Every day | ORAL | Status: DC

## 2013-03-26 NOTE — Patient Instructions (Addendum)
You have been scheduled for a Barium Esophogram at [facility] (1st floor of the hospital) on 03/30/13 at 930 am . Please arrive 15 minutes prior to your appointment for registration. Make certain not to have anything to eat or drink 6 hours prior to your test. If you need to reschedule for any reason, please contact radiology at [phone] to do so. __________________________________________________________________ A barium swallow is an examination that concentrates on views of the esophagus. This tends to be a double contrast exam (barium and two liquids which, when combined, create a gas to distend the wall of the oesophagus) or single contrast (non-ionic iodine based). The study is usually tailored to your symptoms so a good history is essential. Attention is paid during the study to the form, structure and configuration of the esophagus, looking for functional disorders (such as aspiration, dysphagia, achalasia, motility and reflux) EXAMINATION You may be asked to change into a gown, depending on the type of swallow being performed. A radiologist and radiographer will perform the procedure. The radiologist will advise you of the type of contrast selected for your procedure and direct you during the exam. You will be asked to stand, sit or lie in several different positions and to hold a small amount of fluid in your mouth before being asked to swallow while the imaging is performed .In some instances you may be asked to swallow barium coated marshmallows to assess the motility of a solid food bolus. The exam can be recorded as a digital or video fluoroscopy procedure. POST PROCEDURE It will take 1-2 days for the barium to pass through your system. To facilitate this, it is important, unless otherwise directed, to increase your fluids for the next 24-48hrs and to resume your normal diet.  This test typically takes about 30 minutes to  perform. __________________________________________________________________________________ We have sent the following medications to your pharmacy for you to pick up at your convenience: omeprazole  We have given you anti reflux measures.

## 2013-03-26 NOTE — Progress Notes (Signed)
    History of Present Illness: This is a 62 year old female who relates intermittent solid food and pill dysphagia over the past 2 years. She has daily reflux symptoms and takes TUMS daily. Her symptoms have not increased in severity or frequency. Denies weight loss, abdominal pain, constipation, diarrhea, change in stool caliber, melena, hematochezia, nausea, vomiting, chest pain.  Current Medications, Allergies, Past Medical History, Past Surgical History, Family History and Social History were reviewed in [employer] record.  Physical Exam: General: Well developed , well nourished, no acute distress Head: Normocephalic and atraumatic Eyes:  sclerae anicteric, EOMI Ears: Normal auditory acuity Mouth: No deformity or lesions Lungs: Clear throughout to auscultation Heart: Regular rate and rhythm; no murmurs, rubs or bruits Abdomen: Soft, non tender and non distended. No masses, hepatosplenomegaly or hernias noted. Normal Bowel sounds Musculoskeletal: Symmetrical with no gross deformities  Pulses:  Normal pulses noted Extremities: No clubbing, cyanosis, edema or deformities noted Neurological: Alert oriented x 4, grossly nonfocal Psychological:  Alert and cooperative. Normal mood and affect  Assessment and Recommendations:  1. GERD and dysphagia. Rule out esophagitis and esophageal strictures. Begin omeprazole 20 mg daily and standard antireflux measures. Continue TUMS prn. Schedule barium esophagram. Upper endoscopy may be necessary pending esophagram findings.

## 2013-03-30 ENCOUNTER — Ambulatory Visit (HOSPITAL_COMMUNITY)
Admission: RE | Admit: 2013-03-30 | Discharge: 2013-03-30 | Disposition: A | Discharge status: Home / Self Care | Payer: 59 | Source: Ambulatory Visit | Attending: Gastroenterology | Admitting: Gastroenterology

## 2013-03-30 DIAGNOSIS — M5380 Other specified dorsopathies, site unspecified: Secondary | ICD-10-CM | POA: Insufficient documentation

## 2013-03-30 DIAGNOSIS — R1319 Other dysphagia: Secondary | ICD-10-CM

## 2013-03-30 DIAGNOSIS — K219 Gastro-esophageal reflux disease without esophagitis: Secondary | ICD-10-CM | POA: Insufficient documentation

## 2013-03-30 DIAGNOSIS — K222 Esophageal obstruction: Secondary | ICD-10-CM | POA: Insufficient documentation

## 2013-03-30 DIAGNOSIS — K449 Diaphragmatic hernia without obstruction or gangrene: Secondary | ICD-10-CM | POA: Insufficient documentation

## 2013-03-30 IMAGING — RF DG ESOPHAGUS
14 of 24 series · 14 of 24 positions shown · non-contrast
Comparison: None.

FLUOROSCOPY TIME:  2 min and 1 second.

CLINICAL DATA: Reflux.  Dysphagia.

EXAM:
ESOPHOGRAM / BARIUM SWALLOW / BARIUM TABLET STUDY
TECHNIQUE: Combined double contrast and single contrast examination performed
using effervescent crystals, thick barium liquid, and thin barium
liquid. The patient was observed with fluoroscopy swallowing a 13mm
barium sulphate tablet.

[Series 1: run · 1 of 1 slices shown (1 of 14)]
[im 1/1]
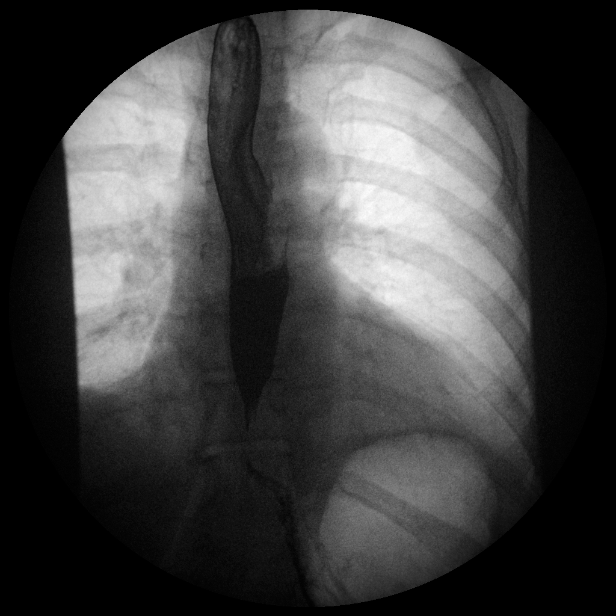

[Series 3: run · 1 of 1 slices shown (2 of 14)]
[im 1/1]
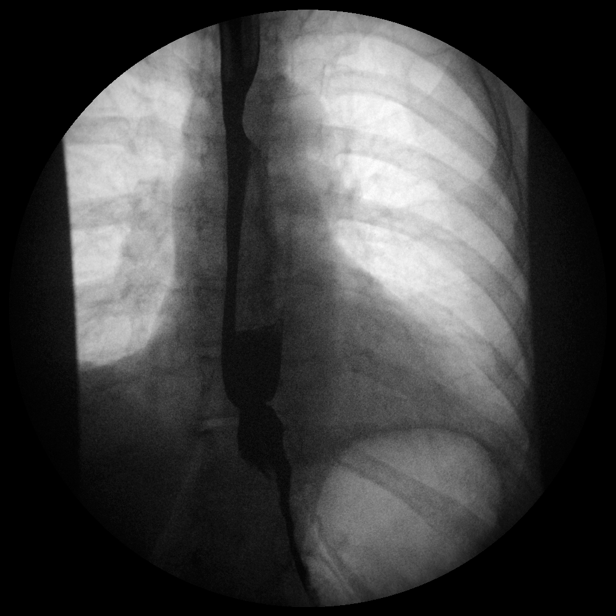

[Series 5: run · 1 of 1 slices shown (3 of 14)]
[im 1/1]
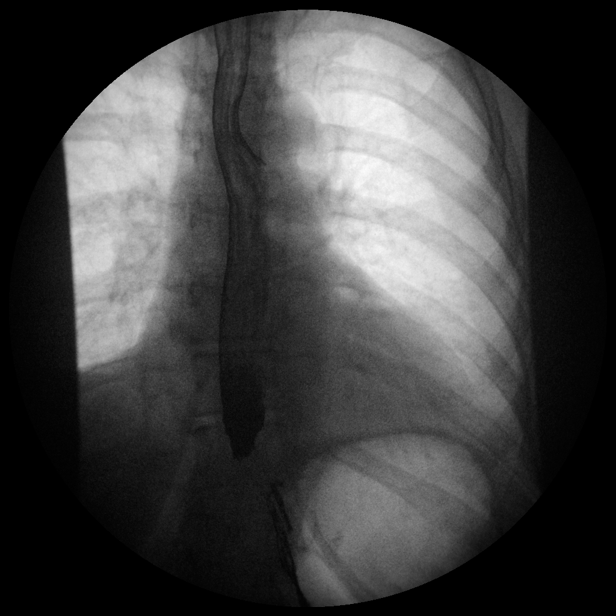

[Series 7: run · 1 of 1 slices shown (4 of 14)]
[im 1/1]
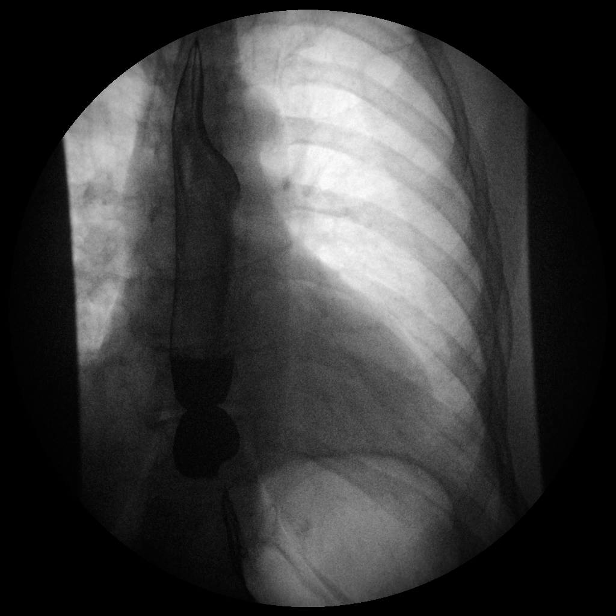

[Series 8: run · 1 of 1 slices shown (5 of 14)]
[im 1/1]
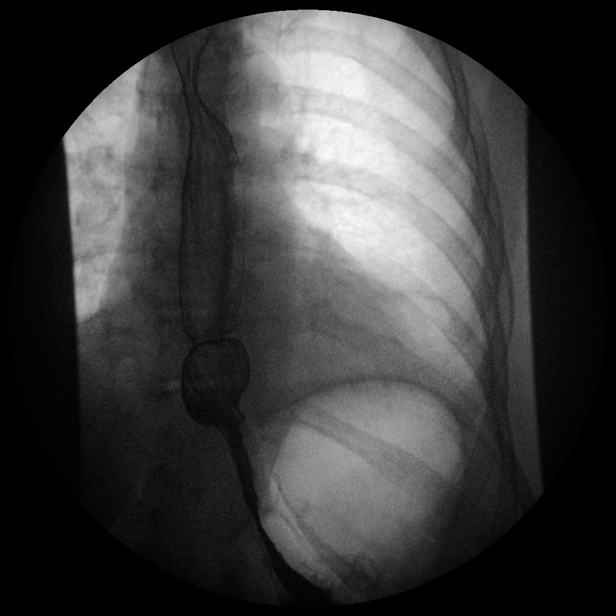

[Series 10: run · 1 of 1 slices shown (6 of 14)]
[im 1/1]
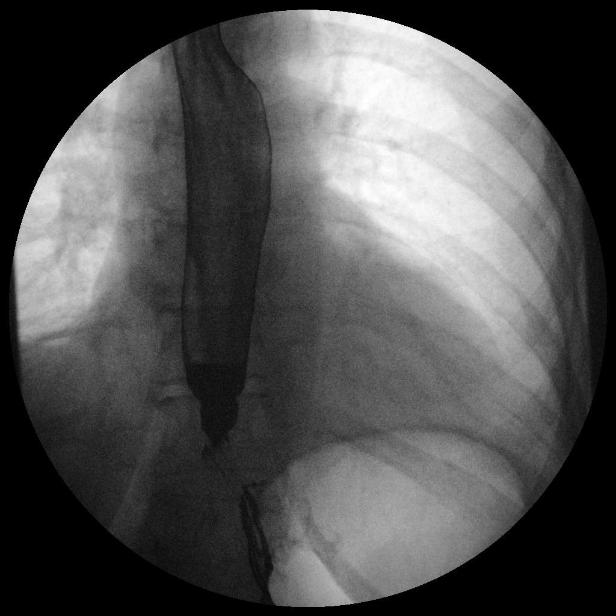

[Series 12: run · 1 of 1 slices shown (7 of 14)]
[im 1/1]
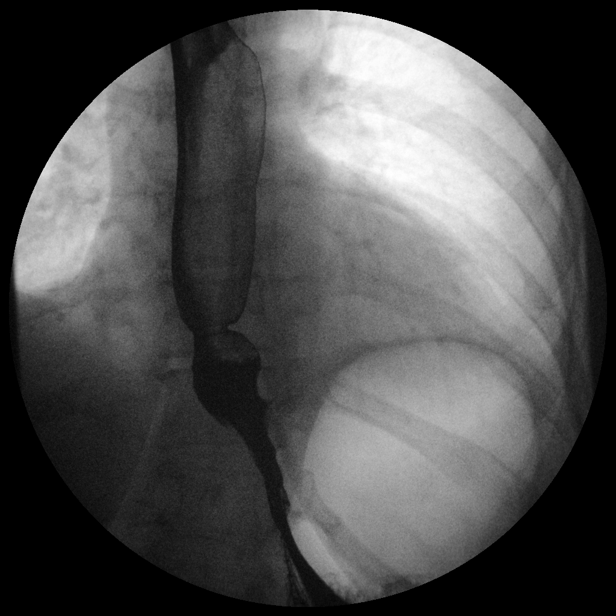

[Series 13: run · 1 of 1 slices shown (8 of 14)]
[im 1/1]
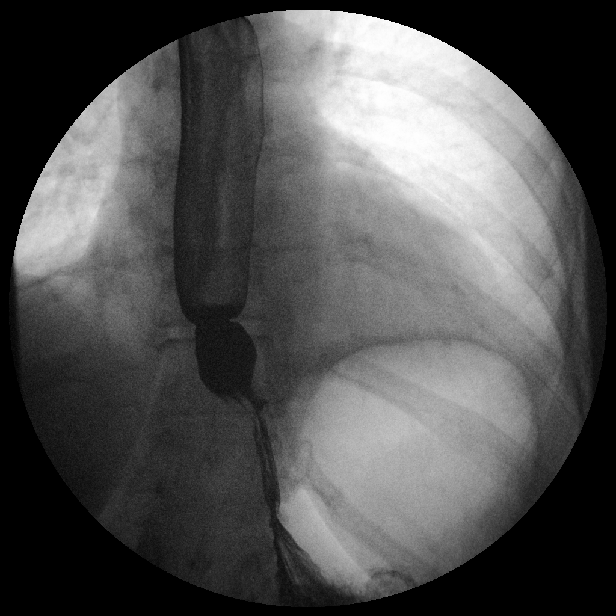

[Series 15: run · 1 of 1 slices shown (9 of 14)]
[im 1/1]
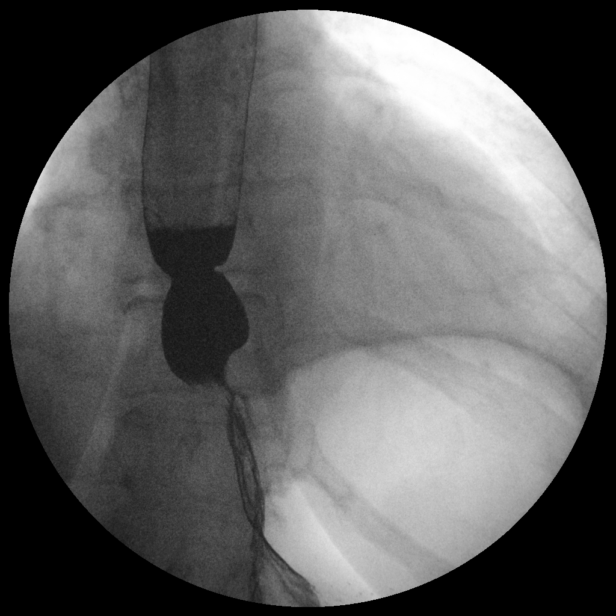

[Series 17: run · 1 of 1 slices shown (10 of 14)]
[im 1/1]
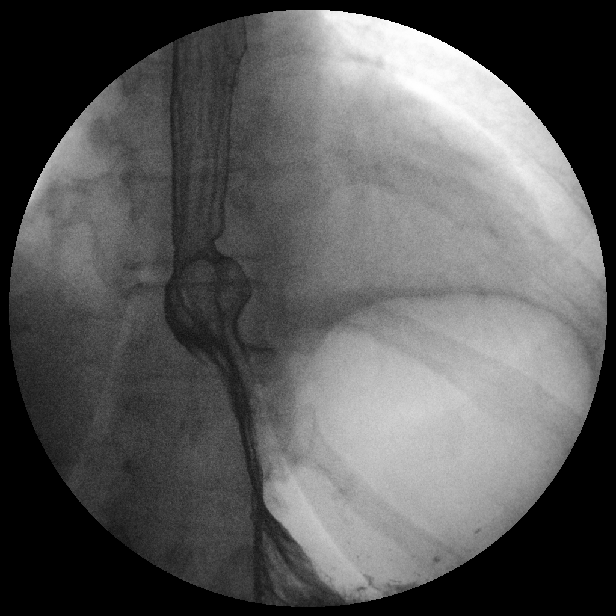

[Series 19: run · 1 of 1 slices shown (11 of 14)]
[im 1/1]
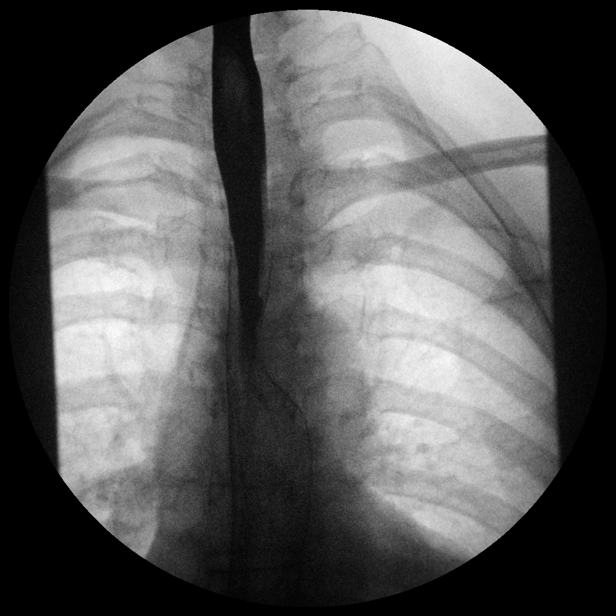

[Series 20: run · 1 of 1 slices shown (12 of 14)]
[im 1/1]
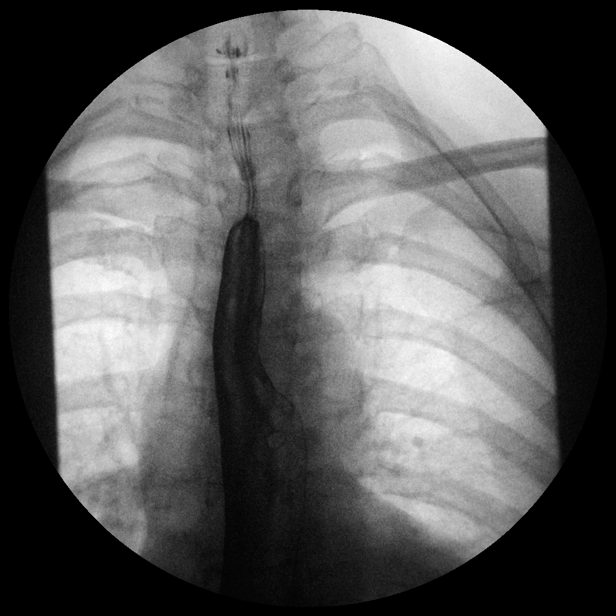

[Series 22: run · 1 of 1 slices shown (13 of 14)]
[im 1/1]
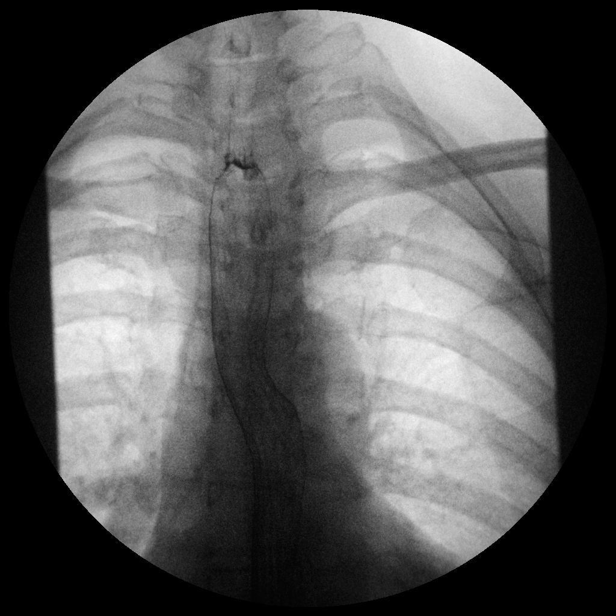

[Series 24: run · 1 of 15 slices shown (14 of 14)]
[im 1/15]
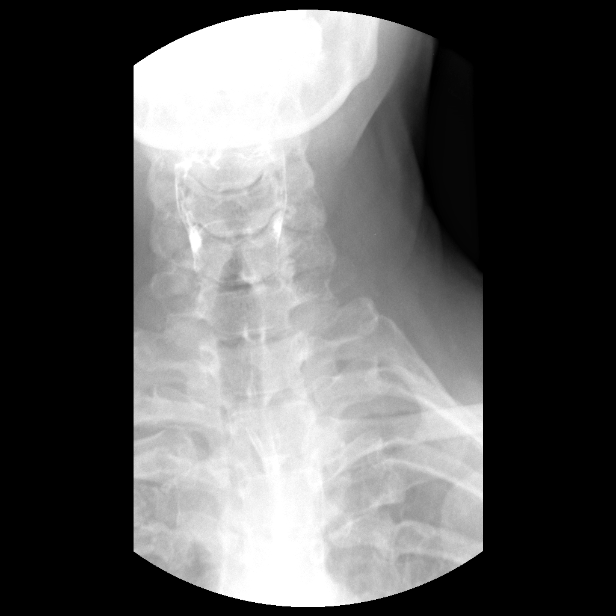

[14 of 24 positions shown; findings below may reference images not displayed]

FINDINGS: No evidence of laryngeal penetration or aspiration. There is mild
impression upon the posterior aspect of the cervical esophagus C3-4
thru C6-7 level secondary to cervical spine anterior osteophyte.

Normal primary esophageal stripping wave.

Small sliding type hiatal hernia. Just above this hiatal hernia
there is smooth circumferential narrowing.

Ingested 13 mm barium tablet with minimal hold up of forward flow at
the level of this narrowing. This clears with subsequent swelling.

Spontaneous gastroesophageal reflux to the level of the upper
thoracic esophagus (exacerbated with change of patient position). No
discrete mucosal abnormality detected.
IMPRESSION: No evidence of laryngeal penetration or aspiration.

Mild Impression upon the posterior aspect of the cervical esophagus
C3-4 thru C6-7 level secondary to cervical spine anterior
osteophyte.

Normal primary esophageal stripping wave.

Small sliding type hiatal hernia.  J

Just above this hiatal hernia there is smooth circumferential
narrowing.

Ingested 13 mm barium tablet with minimal hold up of forward flow at
the level of this narrowing. This clears with subsequent swelling.

Spontaneous gastroesophageal reflux to the level of the upper
thoracic esophagus (exacerbated with change of patient position).

No discrete mucosal abnormality detected.

## 2013-04-06 ENCOUNTER — Encounter: Payer: Self-pay | Admitting: Pulmonary Disease

## 2013-04-06 ENCOUNTER — Telehealth: Payer: Self-pay | Admitting: Gastroenterology

## 2013-04-06 ENCOUNTER — Ambulatory Visit (INDEPENDENT_AMBULATORY_CARE_PROVIDER_SITE_OTHER): Payer: 59 | Admitting: Pulmonary Disease

## 2013-04-06 VITALS — BP 102/76 | HR 70 | Temp 98.3°F | Ht 62.5 in | Wt 170.6 lb

## 2013-04-06 DIAGNOSIS — G4733 Obstructive sleep apnea (adult) (pediatric): Secondary | ICD-10-CM | POA: Insufficient documentation

## 2013-04-06 NOTE — Telephone Encounter (Signed)
All questions answered about BS results and upcoming EGD.

## 2013-04-06 NOTE — Patient Instructions (Signed)
Will schedule for home sleep testing.  Will arrange for followup once the results are available.  

## 2013-04-06 NOTE — Assessment & Plan Note (Signed)
The patient's history is suggestive of clinically significant sleep apnea. I have reviewed the pathophysiology of sleep apnea with her, including its impact to her quality of life and cardiovascular health. He'll need to have a sleep study for diagnosis, and she is an excellent candidate for home sleep testing. The patient is agreeable to this approach.

## 2013-04-06 NOTE — Progress Notes (Signed)
Subjective:    Patient ID: Stefani Dama, female    DOB: 02-Aug-1950, 62 y.o.   MRN: [id number]  HPI The patient is a 62 year old female who I've been asked to see for possible obstructive sleep apnea. She has been noted to have loud snoring, but her husband has not necessarily seen witnessed pauses. The patient does have gasping arousals. She has frequent awakenings at night, and initially feels adequately rested in the mornings. Quickly however, she becomes more sleepy as the day progresses. She will fall asleep in the afternoon any time that she sits down and becomes quiet. She will also fall asleep in the evening watching television or movies. She denies any sleepiness issues with driving. The patient states that her weight is up 30 pounds over the last 2 years, and her Epworth score today is abnormal at 15.   Sleep Questionnaire What time do you typically go to bed?( Between what hours) 9-10 9-10 at 1651 on 04/06/13 by Maisie Fus, CMA How long does it take you to fall asleep? immediately  immediately  at 1651 on 04/06/13 by Maisie Fus, CMA How many times during the night do you wake up? 4 4 at 1651 on 04/06/13 by Maisie Fus, CMA What time do you get out of bed to start your day? No Value 5-530 at 1651 on 04/06/13 by Maisie Fus, CMA Do you drive or operate heavy machinery in your occupation? No No at 1651 on 04/06/13 by Maisie Fus, CMA How much has your weight changed (up or down) over the past two years? (In pounds) 30 lb (13.608 kg) 30 lb (13.608 kg) at 1651 on 04/06/13 by Maisie Fus, CMA Have you ever had a sleep study before? No No at 1651 on 04/06/13 by Maisie Fus, CMA Do you currently use CPAP? No No at 1651 on 04/06/13 by Maisie Fus, CMA Do you wear oxygen at any time? No No at 1651 on 04/06/13 by Maisie Fus, CMA   Review of Systems  Constitutional: Negative for fever and unexpected weight change.  HENT: Positive for trouble  swallowing. Negative for congestion, dental problem, ear pain, nosebleeds, postnasal drip, rhinorrhea, sinus pressure, sneezing and sore throat.   Eyes: Negative for redness and itching.  Respiratory: Negative for cough, chest tightness, shortness of breath and wheezing.   Cardiovascular: Negative for palpitations and leg swelling.  Gastrointestinal: Negative for nausea and vomiting.  Genitourinary: Negative for dysuria.  Musculoskeletal: Negative for joint swelling.  Skin: Negative for rash.  Neurological: Negative for headaches.  Hematological: Does not bruise/bleed easily.  Psychiatric/Behavioral: Negative for dysphoric mood. The patient is not nervous/anxious.        Objective:   Physical Exam Constitutional:  Well developed, no acute distress  HENT:  Nares patent without discharge, mild septal deviation to the left  Oropharynx without exudate, palate and uvula are normal  Eyes:  Perrla, eomi, no scleral icterus  Neck:  No JVD, no TMG  Cardiovascular:  Normal rate, regular rhythm, no rubs or gallops.  No murmurs        Intact distal pulses  Pulmonary :  Normal breath sounds, no stridor or respiratory distress   No rales, rhonchi, or wheezing  Abdominal:  Soft, nondistended, bowel sounds present.  No tenderness noted.   Musculoskeletal:  No lower extremity edema noted.  Lymph Nodes:  No cervical lymphadenopathy noted  Skin:  No cyanosis noted  Neurologic:  Alert, appropriate, moves all  4 extremities without obvious deficit.         Assessment & Plan:

## 2013-04-27 DIAGNOSIS — G4733 Obstructive sleep apnea (adult) (pediatric): Secondary | ICD-10-CM

## 2013-04-28 ENCOUNTER — Telehealth: Payer: Self-pay | Admitting: Gastroenterology

## 2013-04-28 MED ORDER — OMEPRAZOLE 20 MG PO CPDR: 20.0000 mg | DELAYED_RELEASE_CAPSULE | Freq: Every day | ORAL | Status: DC

## 2013-04-28 NOTE — Telephone Encounter (Signed)
Prescription sent to patient's pharmacy for 90 day supply. Pt notified by voicemail of prescription being sent.

## 2013-04-30 DIAGNOSIS — G4733 Obstructive sleep apnea (adult) (pediatric): Secondary | ICD-10-CM

## 2013-05-01 ENCOUNTER — Telehealth: Payer: Self-pay | Admitting: Pulmonary Disease

## 2013-05-01 NOTE — Telephone Encounter (Signed)
Please let pt know that her sleep study is positive for sleep apnea She needs ov to review, and to discuss treatment.

## 2013-05-01 NOTE — Telephone Encounter (Signed)
lmomtcb x1 for pt 

## 2013-05-01 NOTE — Telephone Encounter (Signed)
Called and spoke with pt. Appt scheduled 05/05/13 w/ [facility]. Nothing further needed

## 2013-05-01 NOTE — Telephone Encounter (Signed)
Returning call can be reached at [phone].Bridget Phelps

## 2013-05-05 ENCOUNTER — Encounter: Payer: Self-pay | Admitting: Pulmonary Disease

## 2013-05-05 ENCOUNTER — Ambulatory Visit (INDEPENDENT_AMBULATORY_CARE_PROVIDER_SITE_OTHER): Payer: 59 | Admitting: Pulmonary Disease

## 2013-05-05 ENCOUNTER — Encounter (INDEPENDENT_AMBULATORY_CARE_PROVIDER_SITE_OTHER): Payer: Self-pay

## 2013-05-05 VITALS — BP 120/78 | HR 69 | Temp 97.8°F | Ht 62.5 in | Wt 171.0 lb

## 2013-05-05 DIAGNOSIS — G4733 Obstructive sleep apnea (adult) (pediatric): Secondary | ICD-10-CM

## 2013-05-05 NOTE — Assessment & Plan Note (Signed)
The patient has mild obstructive sleep apnea by her recent study, and I have discussed the various treatment options with her. She may choose to treat this conservatively with a trial of weight loss over time, or more aggressively with either a dental appliance or CPAP while she is trying to work on weight loss. Since this is not a cardiovascular risk for her, the patient would like to take some time and work on weight loss alone. She is to call me if she is unsuccessful, or if she decides to treat this more aggressively.

## 2013-05-05 NOTE — Progress Notes (Signed)
   Subjective:    Patient ID: Bridget Phelps, female    DOB: 06-13-1950, 63 y.o.   MRN: [id number]  HPI The patient comes in today for followup of her recent sleep study. She was found to have mild OSA, with an AHI of 14 events per hour. I have reviewed the study with her and her husband in detail, and answered all of their questions.   Review of Systems  Constitutional: Negative for fever and unexpected weight change.  HENT: Negative for congestion, dental problem, ear pain, nosebleeds, postnasal drip, rhinorrhea, sinus pressure, sneezing, sore throat and trouble swallowing.   Eyes: Negative for redness and itching.  Respiratory: Negative for cough, chest tightness, shortness of breath and wheezing.   Cardiovascular: Negative for palpitations and leg swelling.  Gastrointestinal: Negative for nausea and vomiting.  Genitourinary: Negative for dysuria.  Musculoskeletal: Negative for joint swelling.  Skin: Negative for rash.  Neurological: Negative for headaches.  Hematological: Does not bruise/bleed easily.  Psychiatric/Behavioral: Negative for dysphoric mood. The patient is not nervous/anxious.        Objective:   Physical Exam Overweight female in no acute distress Nose without purulence or discharge noted Neck without lymphadenopathy or thyromegaly Lower extremities without edema, no cyanosis Alert and oriented, moves all 4 extremities.       Assessment & Plan:

## 2013-05-05 NOTE — Patient Instructions (Signed)
Work on weight loss over the next 40mos, and call if you are not having success or if you decide you wish to treat your mild sleep apnea more aggressively.

## 2013-05-08 ENCOUNTER — Ambulatory Visit (AMBULATORY_SURGERY_CENTER): Payer: Self-pay | Admitting: *Deleted

## 2013-05-08 VITALS — Ht 62.5 in | Wt 172.0 lb

## 2013-05-08 DIAGNOSIS — R1319 Other dysphagia: Secondary | ICD-10-CM

## 2013-05-08 NOTE — Progress Notes (Signed)
No allergies to eggs or soy. No problems with anesthesia.  

## 2013-05-12 ENCOUNTER — Encounter: Payer: Self-pay | Admitting: Gastroenterology

## 2013-05-22 ENCOUNTER — Ambulatory Visit (AMBULATORY_SURGERY_CENTER): Payer: 59 | Admitting: Gastroenterology

## 2013-05-22 ENCOUNTER — Encounter: Payer: Self-pay | Admitting: Gastroenterology

## 2013-05-22 VITALS — BP 133/78 | HR 59 | Temp 96.8°F | Resp 22 | Ht 62.0 in | Wt 172.0 lb

## 2013-05-22 DIAGNOSIS — R933 Abnormal findings on diagnostic imaging of other parts of digestive tract: Secondary | ICD-10-CM

## 2013-05-22 DIAGNOSIS — R1319 Other dysphagia: Secondary | ICD-10-CM

## 2013-05-22 DIAGNOSIS — K222 Esophageal obstruction: Secondary | ICD-10-CM

## 2013-05-22 MED ORDER — SODIUM CHLORIDE 0.9 % IV SOLN: 500.0000 mL | INTRAVENOUS | Status: DC

## 2013-05-22 NOTE — Patient Instructions (Signed)
YOU HAD AN ENDOSCOPIC PROCEDURE TODAY AT [facility]: Refer to the procedure report that was given to you for any specific questions about what was found during the examination.  If the procedure report does not answer your questions, please call your gastroenterologist to clarify.  If you requested that your care partner not be given the details of your procedure findings, then the procedure report has been included in a sealed envelope for you to review at your convenience later.  YOU SHOULD EXPECT: Some feelings of bloating in the abdomen. Passage of more gas than usual.  Walking can help get rid of the air that was put into your GI tract during the procedure and reduce the bloating. If you had a lower endoscopy (such as a colonoscopy or flexible sigmoidoscopy) you may notice spotting of blood in your stool or on the toilet paper. If you underwent a bowel prep for your procedure, then you may not have a normal bowel movement for a few days.  DIET: You may have something to drink at 4:30pm.   If that is tolerated, you may start a soft diet due to the dilitation.  You may have a regular diet tomorrow.Drink plenty of fluids but you should avoid alcoholic beverages for 24 hours.  ACTIVITY: Your care partner should take you home directly after the procedure.  You should plan to take it easy, moving slowly for the rest of the day.  You can resume normal activity the day after the procedure however you should NOT DRIVE or use heavy machinery for 24 hours (because of the sedation medicines used during the test).    SYMPTOMS TO REPORT IMMEDIATELY: A gastroenterologist can be reached at any hour.  During normal business hours, 8:30 AM to 5:00 PM Monday through Friday, call [phone].  After hours and on weekends, please call the GI answering service at [phone] who will take a message and have the physician on call contact you.   Following upper endoscopy (EGD)  Vomiting of blood or  coffee ground material  New chest pain or pain under the shoulder blades  Painful or persistently difficult swallowing  New shortness of breath  Fever of 100F or higher  Black, tarry-looking stools  FOLLOW UP: If any biopsies were taken you will be contacted by phone or by letter within the next 1-3 weeks.  Call your gastroenterologist if you have not heard about the biopsies in 3 weeks.  Our staff will call the home number listed on your records the next business day following your procedure to check on you and address any questions or concerns that you may have at that time regarding the information given to you following your procedure. This is a courtesy call and so if there is no answer at the home number and we have not heard from you through the emergency physician on call, we will assume that you have returned to your regular daily activities without incident.  SIGNATURES/CONFIDENTIALITY: You and/or your care partner have signed paperwork which will be entered into your electronic medical record.  These signatures attest to the fact that that the information above on your After Visit Summary has been reviewed and is understood.  Full responsibility of the confidentiality of this discharge information lies with you and/or your care-partner.  Continue to take your anti reflux medicine.

## 2013-05-22 NOTE — Progress Notes (Signed)
Called to room to assist during endoscopic procedure.  Patient ID and intended procedure confirmed with present staff. Received instructions for my participation in the procedure from the performing physician.  

## 2013-05-22 NOTE — Progress Notes (Signed)
Procedure ends, to recovery, report given and VSS. 

## 2013-05-22 NOTE — Op Note (Signed)
[facility]  [address]. [place], [address]   ENDOSCOPY PROCEDURE REPORT  PATIENT: Bridget Phelps, Bridget Phelps  MR#: [id number] BIRTHDATE: 10/21/1950 , 62  yrs. old GENDER: Female ENDOSCOPIST: Ladene Artist, MD, [facility] PROCEDURE DATE:  05/22/2013 PROCEDURE:   EGD with dilatation over guidewire ASA CLASS:   Class II INDICATIONS:dysphagia and Barium esophagram showed stricture. MEDICATIONS: MAC sedation, administered by CRNA and propofol (Diprivan) 200mg  IV TOPICAL ANESTHETIC:   none DESCRIPTION OF PROCEDURE:   After the risks benefits and alternatives of the procedure were thoroughly explained, informed consent was obtained.  The LB [id number]  endoscope was introduced through the mouth  and advanced to the descending duodenum ,     None. The instrument was slowly withdrawn as the mucosa was carefully examined.  ESOPHAGUS: A stricture was found at the gastroesophageal junction. The stenosis was traversable with the endoscope.   The esophagus was otherwise normal.   A 4 cm hiatal hernia was noted. STOMACH: The mucosa and folds of the stomach appeared normal. DUODENUM: The duodenal mucosa showed no abnormalities in the bulb and second portion of the duodenum.  Dilation was then performed at the gastroesphageal junction. Dilator:Savary over guidewire Size:15, 16, 17 mm  Reststance:minimal Heme:none  COMPLICATIONS: There were no complications.  ENDOSCOPIC IMPRESSION: 1.   Stricture at the gastroesophageal junction; dilated 2.   4 cm hiatal hernia  RECOMMENDATIONS: 1.  anti-reflux regimen long term 2.  post dilation instructions 3.  continue PPI daily long term  eSigned:  Ladene Artist, MD, [facility] 05/22/2013 2:59 PM

## 2013-05-25 ENCOUNTER — Telehealth: Payer: Self-pay

## 2013-05-25 NOTE — Telephone Encounter (Signed)
  Follow up Call-  Call back number 05/22/2013 08/06/2011  Post procedure Call Back phone  # [phone]  Permission to leave phone message Yes Yes     Patient questions:  Do you have a fever, pain , or abdominal swelling? no Pain Score  0 *  Have you tolerated food without any problems? yes  Have you been able to return to your normal activities? yes  Do you have any questions about your discharge instructions: Diet   no Medications  no Follow up visit  no  Do you have questions or concerns about your Care? no  Actions: * If pain score is 4 or above: No action needed, pain <4.

## 2013-07-21 ENCOUNTER — Telehealth: Payer: Self-pay | Admitting: Pulmonary Disease

## 2013-07-21 ENCOUNTER — Other Ambulatory Visit: Payer: Self-pay | Admitting: Pulmonary Disease

## 2013-07-21 DIAGNOSIS — G4733 Obstructive sleep apnea (adult) (pediatric): Secondary | ICD-10-CM

## 2013-07-21 NOTE — Telephone Encounter (Signed)
I called made pt aware of [facility] recs. She will call back for appt once she is set up

## 2013-07-21 NOTE — Telephone Encounter (Signed)
Per OV 05/05/13: Patient Instructions      Work on weight loss over the next 63mos, and call if you are not having success or if you decide you wish to treat your mild sleep apnea more aggressively   --  Spoke with pt. She reports she has decided she does want to try CPAP now. Please advise [place] thanks

## 2013-07-21 NOTE — Telephone Encounter (Signed)
Let pt know that i have sent order to a home care company to start cpap.  Let us know if tolerance issues.  Will need to see her back in 8 weeks

## 2013-09-23 ENCOUNTER — Telehealth: Payer: Self-pay | Admitting: Pulmonary Disease

## 2013-09-23 NOTE — Telephone Encounter (Signed)
Called and spoke with pt and she stated that she was not able to use the cpap last night due to the mask not fitting properly.  Pt stated that she has not been able to get this corrected by [employer].  She stated that they want to charge her everytime that she has to change the mask.  She will call back up there today to try and get this taken care of.  She stated that she will keep the appt with [facility] for now, but will call back for any concerns.

## 2013-10-02 ENCOUNTER — Ambulatory Visit (INDEPENDENT_AMBULATORY_CARE_PROVIDER_SITE_OTHER): Payer: 59 | Admitting: Pulmonary Disease

## 2013-10-02 ENCOUNTER — Encounter: Payer: Self-pay | Admitting: Pulmonary Disease

## 2013-10-02 VITALS — BP 112/70 | HR 78 | Temp 98.2°F | Ht 62.5 in | Wt 170.0 lb

## 2013-10-02 DIAGNOSIS — G4733 Obstructive sleep apnea (adult) (pediatric): Secondary | ICD-10-CM

## 2013-10-02 NOTE — Patient Instructions (Signed)
Continue with cpap, and keep up with mask changes and supplies. Work on weight loss followup with me again in 89mos.

## 2013-10-02 NOTE — Progress Notes (Signed)
   Subjective:    Patient ID: Bridget Phelps, female    DOB: 07/25/1950, 62 y.o.   MRN: [id number]  HPI Patient comes in today for followup of her obstructive sleep apnea. She is wearing CPAP compliantly by her download, and has had an excellent response to treatment. She is sleeping much better at night, and sees improvement daytime alertness. She has finally found a mask that fits well for her with no significant leak.   Review of Systems  Constitutional: Negative for fever and unexpected weight change.  HENT: Negative for congestion, dental problem, ear pain, nosebleeds, postnasal drip, rhinorrhea, sinus pressure, sneezing, sore throat and trouble swallowing.   Eyes: Negative for redness and itching.  Respiratory: Negative for cough, chest tightness, shortness of breath and wheezing.   Cardiovascular: Negative for palpitations and leg swelling.  Gastrointestinal: Negative for nausea and vomiting.  Genitourinary: Negative for dysuria.  Musculoskeletal: Negative for joint swelling.  Skin: Negative for rash.  Neurological: Negative for headaches.  Hematological: Does not bruise/bleed easily.  Psychiatric/Behavioral: Negative for dysphoric mood. The patient is not nervous/anxious.        Objective:   Physical Exam Overweight female in no acute distress Nose without purulence or discharge noted No skin breakdown or pressure necrosis from the CPAP mask Neck without lymphadenopathy or thyromegaly Lower extremities with minimal edema, no cyanosis Alert and oriented, does not appear to be sleepy, moves all 4 extremities.       Assessment & Plan:

## 2013-10-02 NOTE — Assessment & Plan Note (Signed)
The patient appears to be doing well from a sleep apnea standpoint, and I've asked her to continue on CPAP while she is trying to work on weight loss. I will see her back in 6 months to check on her progress.

## 2014-02-14 ENCOUNTER — Other Ambulatory Visit: Payer: Self-pay | Admitting: Gastroenterology

## 2014-04-05 ENCOUNTER — Ambulatory Visit: Payer: 59 | Admitting: Pulmonary Disease

## 2014-12-28 ENCOUNTER — Telehealth: Payer: Self-pay | Admitting: Pulmonary Disease

## 2014-12-28 NOTE — Telephone Encounter (Signed)
I can sign order, but she will need to also be scheduled for ROV with me or Tammy Parrett.

## 2014-12-28 NOTE — Telephone Encounter (Signed)
Called and advised Bridget Phelps that Dr. Halford Chessman will sign for supplies. Called patient to schedule appointment, she is getting ready to go on vacation and will be gone for a while, she said she will call back to schedule OV when she gets back from vacation. Nothing further needed.

## 2014-12-28 NOTE — Telephone Encounter (Signed)
Spoke with Danae Chen at [employer].  She said that she has an old order from Dr. Gwenette Greet for CPAP Supplies.  Patient came by this morning and picked up supplies and she needs a provider to sign the order for the supplies that she picked up.  I advised Danae Chen that patient has not been seen since 10/02/2013.  She wanted to know if another provider will sign for the supplies.  Dr. Halford Chessman, will you sign for these supplies?  Please advise.

## 2015-02-15 ENCOUNTER — Encounter: Payer: Self-pay | Admitting: Internal Medicine

## 2015-02-15 ENCOUNTER — Ambulatory Visit (INDEPENDENT_AMBULATORY_CARE_PROVIDER_SITE_OTHER): Payer: Medicare HMO | Admitting: Internal Medicine

## 2015-02-15 VITALS — BP 106/74 | HR 56 | Ht 63.0 in | Wt 177.8 lb

## 2015-02-15 DIAGNOSIS — I209 Angina pectoris, unspecified: Secondary | ICD-10-CM

## 2015-02-15 DIAGNOSIS — G4733 Obstructive sleep apnea (adult) (pediatric): Secondary | ICD-10-CM

## 2015-02-15 DIAGNOSIS — I10 Essential (primary) hypertension: Secondary | ICD-10-CM | POA: Diagnosis not present

## 2015-02-15 NOTE — Assessment & Plan Note (Signed)
In the best review of chronic cough to date (week of 01/31/15 NEJM) ,  ACEi are rated at causing cough in 20% of pts which is a 4 fold increase from previous reports and does not include the variety of non-specific complaints we see in pulmonary clinic in pts on ACEi but previously attributed to copd/asthma to include PNDS, throat and chest congestion, "bronchitis", unexplained dyspnea and noct "strangling" sensations as well as atypical /refractory GERD symptoms like upper dysphagia.   The only way to prove this is not an "ACEi Case" is a trial off ACEi x a minimum of 6 weeks then regroup but she may have AP rec cards f/u and consider change to BB then

## 2015-02-15 NOTE — Progress Notes (Signed)
Patient ID: Bridget Phelps, female   DOB: January 01, 1951,   MRN: [id number]   Brief patient profile:  74 yowf never smoker previously followed by Dr Gwenette Greet for OSA      History of Present Illness  02/15/2015 1st  office visit/ Wert re: transition of care re osa complicated by hbp/ on acei  With ? AP Chief Complaint  Patient presents with  . Follow-up    Former patient of Dr Gwenette Greet. Pt needing CPAP supplies renewed. She denies any problems with machine. She wants to try nasal mask rather than full face mask.   snoring resolved on full face mask,  Feels rested but EPWORTH score 10  Does c/o "bad colds" x last few years but fine between them on acei chronically  New cc neck pain x one year just with ex, never at rest or sleeping, happens every time she pushes herself so avoids it - no assoc nausea, resolves within a few min of resting s radiation to arms     No obvious day to day or daytime variability or assoc chronic cough   or chest tightness, subjective wheeze or overt sinus or hb symptoms. No unusual exp hx or h/o childhood pna/ asthma or knowledge of premature birth.  Sleeping ok without nocturnal  or early am exacerbation  of respiratory  c/o's or need for noct saba. Also denies any obvious fluctuation of symptoms with weather or environmental changes or other aggravating or alleviating factors except as outlined above   Current Medications, Allergies, Complete Past Medical History, Past Surgical History, Family History, and Social History were reviewed in [employer] record.  ROS  The following are not active complaints unless bolded sore throat, dysphagia, dental problems, itching, sneezing,  nasal congestion or excess/ purulent secretions, ear ache,   fever, chills, sweats, unintended wt loss, classically pleuritic cp, hemoptysis,  orthopnea pnd or leg swelling, presyncope, palpitations, abdominal pain, anorexia, nausea, vomiting, diarrhea  or change in bowel or bladder  habits, change in stools or urine, dysuria,hematuria,  rash, arthralgias, visual complaints, headache, numbness, weakness or ataxia or problems with walking or coordination,  change in mood/affect or memory.      Objective:   Physical Exam    amb nad    Wt Readings from Last 3 Encounters:  02/15/15 177 lb 12.8 oz (80.65 kg)  10/02/13 170 lb (77.111 kg)  05/22/13 172 lb (78.019 kg)    Vital signs reviewed        HEENT: nl dentition, turbinates, and orophanx. Nl external ear canals without cough reflex Modified Mallampati Score = 1   NECK :  without JVD/Nodes/TM/ nl carotid upstrokes bilaterally   LUNGS: no acc muscle use, clear to A and P bilaterally without cough on insp or exp maneuvers   CV:  RRR  no s3 or murmur or increase in P2, no edema   ABD:  soft and nontender with nl excursion in the supine position. No bruits or organomegaly, bowel sounds nl  MS:  warm without deformities, calf tenderness, cyanosis or clubbing  SKIN: warm and dry without lesions    NEURO:  alert, approp, no deficits         Assessment:

## 2015-02-15 NOTE — Patient Instructions (Addendum)
Strongly consider future  alternative to ACE inhibitor (lisinopril) because you tend to have really bad colds and that should solve your problem or return here for any cough that lasts more than 2 weeks   Please see patient coordinator before you leave today  to schedule mask of choice and cardiology consultation for your neck pain with exercise   F/u Dr Halford Chessman in 3 months or first available after that

## 2015-02-15 NOTE — Assessment & Plan Note (Addendum)
HST 04/2013:  AHI 14/hr.  - rec mask of choice 02/15/2015 and referred to [facility]

## 2015-02-18 ENCOUNTER — Encounter: Payer: Self-pay | Admitting: Internal Medicine

## 2015-02-18 NOTE — Assessment & Plan Note (Addendum)
Note on esophagram 03/30/2013:   Mild Impression upon the posterior aspect of the cervical esophagus C3-4 thru C6-7 level secondary to cervical spine anterior osteophyte. Normal primary esophageal stripping wave. Small sliding type hiatal hernia. J Just above this hiatal hernia there is smooth circumferential narrowing Ingested 13 mm barium tablet with minimal hold up of forward flow at the level of this narrowing. This clears with subsequent swelling. Spontaneous gastroesophageal reflux to the level of the upper thoracic esophagus (exacerbated with change of patient position).  She is no longer having trouble swallowing main on prilosec but the neck pain with ex is much more suggestive of AP and her younger brother has IHD - since it is so reproducible with ex rec first cards eval then consider refer back to GI or neurosurgery to sort out but if it is angina it is stable so no immediate need for admit and already on asa   I had an extended discussion with the patient reviewing all relevant studies completed to date and  lasting 25 minutes of a 40 minute transition of care office visit    Each maintenance medication was reviewed in detail including most importantly the difference between maintenance and prns and under what circumstances the prns are to be triggered using an action plan format that is not reflected in the computer generated alphabetically organized AVS.    Please see instructions for details which were reviewed in writing and the patient given a copy highlighting the part that I personally wrote and discussed at today's ov.

## 2015-02-18 NOTE — Assessment & Plan Note (Signed)
Complicated by hbp/ hyperlipidemia/ osa   Body mass index is 31.5 kg/(m^2).  No results found for: TSH   Contributing to gerd tendency/ doe/reviewed the need and the process to achieve and maintain neg calorie balance > defer f/u primary care including intermittently monitoring thyroid status

## 2015-02-24 ENCOUNTER — Ambulatory Visit: Payer: Medicare HMO | Admitting: Cardiology

## 2015-03-27 NOTE — H&P (Signed)
OFFICE VISIT NOTES COPIED TO EPIC FOR DOCUMENTATION Bridget Phelps 03-25-15 8:35 AM Location: [facility] Patient #: [id number] DOB: Apr 28, 1950 Married / Language: English / Race: White Female   History of Present Illness Bridget Phelps AGNP-C; 03/24/2015 8:33 AM) The patient is a 64 year old female who presents for a follow-up for Dyspnea. She presents for evaluation of exertional shortness of breath, neck pain, and headache over the past year. She states symptoms are infrequent and only happen with extreme exertion, however she recently mentioned them to Dr. Melvyn Novas when she went for reevaluation of sleep apnea and was advised to have cardiology consult. She states she will develop pain at the base of her head and in her neck when exerting herself. Denies chest pain or back pain.  She has a history of hyperlipidemia, on red yeast rice, and hypertension. It has been almost a year since labs were last checked but she states they were controlled at that time. No history of diabetes or thyroid disease. She is a nonsmoker. She had a sister who died at age 46 from a heart attack. She was scheduled for exercise myoview and echocardiogram and presents today for follow up. No new symptoms or concerns today.   Problem List/Past Medical (Bridget Phelps; 25-Mar-2015 2:33 PM) Hypertension, benign (I10) Hypercholesterolemia (E78.00) Family history of early CAD (Z62.49) Sister died at age 32 from MI Exercise myoview stress 02/25/2015: 1. The resting electrocardiogram demonstrated normal sinus rhythm, normal resting conduction and no resting arrhythmias. The stress electrocardiogram was normal. The patient performed treadmill exercise using a Bruce protocol, completing 7:44 minutes. The patient completed an estimated workload of 9.74 METS, 95% of the maximum predicted heart rate. The stress test was terminated because of fatigue. 2. Myocardial perfusion imaging is normal. Overall left  ventricular systolic function was normal without regional wall motion abnormalities. The left ventricular ejection fraction was 74%. Dyspnea on exertion (R06.09) Echocardiogram 03/08/2015: 1. Left ventricle cavity is normal in size. Mild concentric hypertrophy of the left ventricle. Normal global wall motion. Doppler evidence of grade I (impaired) diastolic dysfunction. Calculated EF 59%. 2. Left atrial cavity is moderately dilated. 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 Consider TEE to better evaluate the structures. 3. Right ventricle cavity is mildly dilated. Mild concentric hypertrophy of the right ventricle. Normal right ventricular function. 4. Trace tricuspid regurgitation. Unable to estimate PA pressure due to absence/minimal TR signal. OSA (obstructive sleep apnea) (G47.33) Obesity (BMI 30.0-34.9) (E66.9)  Allergies (Bridget Garrison; 25-Mar-2015 2:33 PM) Sulfa Antibiotics Rash.  Family History (Bridget Phelps; 03/25/15 2:33 PM) Mother Deceased. at age 81 from complications from stroke; several TIA's prior to this event, no heart attacks, history of carotid surgery, HTN, hyperlipidemia but no other cardiovascular conditions Father Deceased. at age 64 from cancer; First MI at age 53, two total in his lifetime; no strokes, HTN, hyperlipidemia, but no other cardiovascular conditions Sister 1 Deceased. at age 65 from MI; DM, HTN, but no known cardiovascular conditions; 6 yrs younger Sister 2 Deceased. at age 33 from meningitis; 2 yrs older  Social History (Bridget Garrison; 25-Mar-2015 2:33 PM) Current tobacco use Never smoker. Number of Children 4. Living Situation Lives with spouse. Marital status Married. Alcohol Use Moderate alcohol use. a galss of wine per night  Past Surgical History (Bridget Garrison; March 25, 2015 2:33 PM) Hysterectomy; [phone]  partial Bunionectomy2012 Bilateral. first one in 1981  Medication History (Bridget Garrison; 03/25/15  2:39 PM) Aspirin (81MG  Tablet, 1 Oral daily) Active. Bee Pollen (1000MG  Tablet, 1 Oral daily) Active. Krill Oil (300MG  Capsule, 1 Oral daily) Active. Omeprazole (20MG  Capsule DR, 1 Oral daily) Active. Red Yeast Rice Extract (600MG  Capsule, 1 Oral daily) Active. Lisinopril-Hydrochlorothiazide (20-12.5MG  Tablet, 1 Oral daily) Active. Medications Reconciled (list present)  Diagnostic Studies History (Bridget Garrison; 2015-03-28 2:34 PM) Echocardiogram11/22/2016 1. Left ventricle cavity is normal in size. Mild concentric hypertrophy of the left ventricle. Normal global wall motion. Doppler evidence of grade I (impaired) diastolic dysfunction. Calculated EF 59%. 2. Left atrial cavity is moderately dilated. 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 Consider TEE to better evaluate the structures. 3. Right ventricle cavity is mildly dilated. Mild concentric hypertrophy of the right ventricle. Normal right ventricular function. 4. Trace tricuspid regurgitation. Unable to estimate PA pressure due to absence/minimal TR signal. Nuclear stress test11/02/2015 1. The resting electrocardiogram demonstrated normal sinus rhythm, normal resting conduction and no resting arrhythmias. The stress electrocardiogram was normal. The patient performed treadmill exercise using a Bruce protocol, completing 7:44 minutes. The patient completed an estimated workload of 9.74 METS, 95% of the maximum predicted heart rate. The stress test was terminated because of fatigue. 2. Myocardial perfusion imaging is normal. Overall left ventricular systolic function was normal without regional wall motion abnormalities. The left ventricular ejection fraction was 74%. Labwork 03/09/2014: Total cholesterol 223,  triglycerides 152, HDL 56, LDL 137, TSH 1.94, creatinine 0.60, potassium 4.3, CMP normal, CBC normal Coronary Angiogram1995 Treadmill stress test2004 Endoscopy02/09/2003 Upper Colonoscopy04/22/2013 Sleep Study01/03/2014    Review of Systems (Bridgette Ebony Hail AGNP-C; 03/24/2015 8:32 AM) General Not Present- Anorexia, Fatigue and Fever. Respiratory Present- Decreased Exercise Tolerance and Difficulty Breathing on Exertion. Not Present- Cough. Cardiovascular Not Present- Chest Pain, Claudications, Edema, Orthopnea, Paroxysmal Nocturnal Dyspnea and Shortness of Breath. Gastrointestinal Not Present- Change in Bowel Habits, Constipation and Nausea. Neurological Present- Headaches. Not Present- Focal Neurological Symptoms. Endocrine Not Present- Appetite Changes, Cold Intolerance and Heat Intolerance. Hematology Not Present- Anemia, Petechiae and Prolonged Bleeding.  Vitals (Bridget Garrison; Mar 28, 2015 2:41 PM) 03/28/2015 2:34 PM Weight: 178.06 lb Height: 63in Body Surface Area: 1.84 m Body Mass Index: 31.54 kg/m  Pulse: 76 (Regular)  P.OX: 98% (Room air) BP: 128/86 (Sitting, Left Arm, Standard)       Physical Exam (Bridgette Ebony Hail, AGNP-C; 03/24/2015 8:33 AM) General Mental Status-Alert. General Appearance-Cooperative, Appears stated age, Not in acute distress. Orientation-Oriented X3. Build & Nutrition-Moderately built and Mildly obese.  Head and Neck Thyroid Gland Characteristics - no palpable nodules, no palpable enlargement.  Chest and Lung Exam Palpation Tender - No chest wall tenderness. Auscultation Breath sounds - Clear.  Cardiovascular Inspection Jugular vein - Right - No Distention. Auscultation Heart Sounds - S1 WNL, S2 WNL and No gallop present. Murmurs & Other Heart Sounds - Murmur - No murmur.  Abdomen Palpation/Percussion Normal exam - Non Tender and No hepatosplenomegaly. Auscultation Normal exam - Bowel sounds  normal.  Peripheral Vascular Lower Extremity Inspection - Left - No Pigmentation, No Varicose veins. Right - No Pigmentation, No Varicose veins. Palpation - Edema - Left - No edema. Right - No edema. Femoral pulse - Left - Normal. Right - Normal. Popliteal pulse - Left - Normal. Right - Normal. Dorsalis pedis pulse - Left - Normal. Right - Normal. Posterior tibial pulse - Left - Normal. Right - Normal. Carotid arteries - Left-No Carotid bruit. Carotid arteries - Right-No Carotid bruit.  Abdomen-No prominent abdominal aortic pulsation, No epigastric bruit.  Neurologic Motor-Grossly intact without any focal deficits.  Musculoskeletal Global Assessment Left Lower Extremity - normal range of motion without pain. Right Lower Extremity - normal range of motion without pain.    Assessment & Plan (Bridgette Allison AGNP-C; 03/24/2015 8:30 AM) Dyspnea on exertion (R06.09) Story: Echocardiogram 03/08/2015: 1. Left ventricle cavity is normal in size. Mild concentric hypertrophy of the left ventricle. Normal global wall motion. Doppler evidence of grade I (impaired) diastolic dysfunction. Calculated EF 59%. 2. Left atrial cavity is moderately dilated. 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 Consider TEE to better evaluate the structures. 3. Right ventricle cavity is mildly dilated. Mild concentric hypertrophy of the right ventricle. Normal right ventricular function. 4. Trace tricuspid regurgitation. Unable to estimate PA pressure due to absence/minimal TR signal. Impression: EKG 02/23/2015: Sinus rhythm at a rate of 69 bpm, normal axis, normal intervals, no evidence of ischemia. ASD (atrial septal defect) (Q21.1) Recurrent occipital headache (R51) Family history of early CAD (Z50.49) Story: Sister died at age 96 from MI  Exercise myoview stress 02/25/2015: 1. The  resting electrocardiogram demonstrated normal sinus rhythm, normal resting conduction and no resting arrhythmias. The stress electrocardiogram was normal. The patient performed treadmill exercise using a Bruce protocol, completing 7:44 minutes. The patient completed an estimated workload of 9.74 METS, 95% of the maximum predicted heart rate. The stress test was terminated because of fatigue. 2. Myocardial perfusion imaging is normal. Overall left ventricular systolic function was normal without regional wall motion abnormalities. The left ventricular ejection fraction was 74%. Current Plans Mechanism of underlying disease process and action of medications discussed with the patient. I discussed primary/secondary prevention and also dietary counseling was done. She presents for follow-up of stress test echocardiogram performed due to dyspnea and occipital headaches. Stress test was normal without evidence of ischemia. Echocardiogram revealed a large fenestrated ASD with mild RVH and mild RV dilation. Pathophysiology was explained at length with patient and have recommended TEE for further evaluation. Follow-up after TEE for further recommendations.  CC: Dr. Juanita Craver, Dr. Christinia Gully    Signed by Bridget Phelps, AGNP-C (03/24/2015 8:34 AM)

## 2015-03-28 ENCOUNTER — Encounter (HOSPITAL_COMMUNITY)
Admission: RE | Disposition: A | Discharge status: Home / Self Care | Payer: Self-pay | Source: Ambulatory Visit | Attending: Cardiology

## 2015-03-28 ENCOUNTER — Ambulatory Visit (HOSPITAL_COMMUNITY)
Admission: RE | Admit: 2015-03-28 | Discharge: 2015-03-28 | Disposition: A | Discharge status: Home / Self Care | Payer: Medicare HMO | Source: Ambulatory Visit | Attending: Cardiology | Admitting: Cardiology

## 2015-03-28 ENCOUNTER — Encounter (HOSPITAL_COMMUNITY): Payer: Self-pay

## 2015-03-28 DIAGNOSIS — E785 Hyperlipidemia, unspecified: Secondary | ICD-10-CM | POA: Insufficient documentation

## 2015-03-28 DIAGNOSIS — Z8249 Family history of ischemic heart disease and other diseases of the circulatory system: Secondary | ICD-10-CM | POA: Diagnosis not present

## 2015-03-28 DIAGNOSIS — E7800 Pure hypercholesterolemia, unspecified: Secondary | ICD-10-CM | POA: Diagnosis not present

## 2015-03-28 DIAGNOSIS — R0600 Dyspnea, unspecified: Secondary | ICD-10-CM | POA: Insufficient documentation

## 2015-03-28 DIAGNOSIS — Z6831 Body mass index (BMI) 31.0-31.9, adult: Secondary | ICD-10-CM | POA: Insufficient documentation

## 2015-03-28 DIAGNOSIS — Z79899 Other long term (current) drug therapy: Secondary | ICD-10-CM | POA: Insufficient documentation

## 2015-03-28 DIAGNOSIS — Z7982 Long term (current) use of aspirin: Secondary | ICD-10-CM | POA: Insufficient documentation

## 2015-03-28 DIAGNOSIS — Q211 Atrial septal defect: Secondary | ICD-10-CM | POA: Insufficient documentation

## 2015-03-28 DIAGNOSIS — I10 Essential (primary) hypertension: Secondary | ICD-10-CM | POA: Insufficient documentation

## 2015-03-28 DIAGNOSIS — E669 Obesity, unspecified: Secondary | ICD-10-CM | POA: Diagnosis not present

## 2015-03-28 DIAGNOSIS — G4733 Obstructive sleep apnea (adult) (pediatric): Secondary | ICD-10-CM | POA: Diagnosis not present

## 2015-03-28 HISTORY — PX: TEE WITHOUT CARDIOVERSION: SHX5443

## 2015-03-28 SURGERY — ECHOCARDIOGRAM, TRANSESOPHAGEAL
Anesthesia: Moderate Sedation

## 2015-03-28 MED ORDER — FENTANYL CITRATE (PF) 100 MCG/2ML IJ SOLN
INTRAMUSCULAR | Status: DC | PRN
  Administered 2015-03-28: 50 ug via INTRAVENOUS

## 2015-03-28 MED ORDER — SODIUM CHLORIDE 0.9 % IV SOLN: INTRAVENOUS | Status: DC

## 2015-03-28 MED ORDER — MIDAZOLAM HCL 5 MG/ML IJ SOLN
INTRAMUSCULAR | Status: AC
  Filled 2015-03-28: qty 2

## 2015-03-28 MED ORDER — FENTANYL CITRATE (PF) 100 MCG/2ML IJ SOLN
INTRAMUSCULAR | Status: AC
  Filled 2015-03-28: qty 2

## 2015-03-28 MED ORDER — BUTAMBEN-TETRACAINE-BENZOCAINE 2-2-14 % EX AERO
INHALATION_SPRAY | CUTANEOUS | Status: DC | PRN
  Administered 2015-03-28: 2 via TOPICAL

## 2015-03-28 MED ORDER — MIDAZOLAM HCL 10 MG/2ML IJ SOLN
INTRAMUSCULAR | Status: DC | PRN
Start: 2015-03-28 — End: 2015-03-28
  Administered 2015-03-28: 1 mg via INTRAVENOUS
  Administered 2015-03-28: 2 mg via INTRAVENOUS

## 2015-03-28 NOTE — Discharge Instructions (Signed)
Transesophageal Echocardiogram °Transesophageal echocardiography (TEE) is a special type of test that produces images of the heart by using sound waves (echocardiogram). This type of echocardiography can obtain better images of the heart than standard echocardiography. TEE is done by passing a flexible tube down the esophagus. The heart is located in front of the esophagus. Because the heart and esophagus are close to one another, your health care provider can take very clear, detailed pictures of the heart via ultrasound waves. °TEE may be done: °· If your health care provider needs more information based on standard echocardiography findings. °· If you had a stroke. This might have happened because a clot formed in your heart. TEE can visualize different areas of the heart and check for clots. °· To check valve anatomy and function. °· To check for infection on the inside of your heart (endocarditis). °· To evaluate the dividing wall (septum) of the heart and presence of a hole that did not close after birth (patent foramen ovale or atrial septal defect). °· To help diagnose a tear in the wall of the aorta (aortic dissection). °· During cardiac valve surgery. This allows the surgeon to assess the valve repair before closing the chest. °· During a variety of other cardiac procedures to guide positioning of catheters. °· Sometimes before a cardioversion, which is a shock to convert heart rhythm back to normal. °LET YOUR HEALTH CARE PROVIDER KNOW ABOUT:  °· Any allergies you have. °· All medicines you are taking, including vitamins, herbs, eye drops, creams, and over-the-counter medicines. °· Previous problems you or members of your family have had with the use of anesthetics. °· Any blood disorders you have. °· Previous surgeries you have had. °· Medical conditions you have. °· Swallowing difficulties. °· An esophageal obstruction. °RISKS AND COMPLICATIONS  °Generally, TEE is a safe procedure. However, as with any  procedure, complications can occur. Possible complications include an esophageal tear (rupture). °BEFORE THE PROCEDURE  °· Do not eat or drink for 6 hours before the procedure or as directed by your health care provider. °· Arrange for someone to drive you home after the procedure. Do not drive yourself home. During the procedure, you will be given medicines that can continue to make you feel drowsy and can impair your reflexes. °· An IV access tube will be started in the arm. °PROCEDURE  °· A medicine to help you relax (sedative) will be given through the IV access tube. °· A medicine may be sprayed or gargled to numb the back of the throat. °· Your blood pressure, heart rate, and breathing (vital signs) will be monitored during the procedure. °· The TEE probe is a long, flexible tube. The tip of the probe is placed into the back of the mouth, and you will be asked to swallow. This helps to pass the tip of the probe into the esophagus. Once the tip of the probe is in the correct area, your health care provider can take pictures of the heart. °· TEE is usually not a painful procedure. You may feel the probe press against the back of the throat. The probe does not enter the trachea and does not affect your breathing. °AFTER THE PROCEDURE  °· You will be in bed, resting, until you have fully returned to consciousness. °· When you first awaken, your throat may feel slightly sore and will probably still feel numb. This will improve slowly over time. °· You will not be allowed to eat or drink until it   is clear that the numbness has improved.  Once you have been able to drink, urinate, and sit on the edge of the bed without feeling sick to your stomach (nausea) or dizzy, you may be cleared to go home.  You should have a friend or family member with you for the next 24 hours after your procedure.   This information is not intended to replace advice given to you by your health care provider. Make sure you discuss any  questions you have with your health care provider.   Document Released: 06/23/2002 Document Revised: 04/07/2013 Document Reviewed: 10/02/2012 Elsevier Interactive Patient Education 2016 Elsevier Inc[employer]   Moderate Conscious Sedation, Adult Sedation is the use of medicines to promote relaxation and relieve discomfort and anxiety. Moderate conscious sedation is a type of sedation. Under moderate conscious sedation you are less alert than normal but are still able to respond to instructions or stimulation. Moderate conscious sedation is used during short medical and dental procedures. It is milder than deep sedation or general anesthesia and allows you to return to your regular activities sooner. LET [facility] CARE PROVIDER KNOW ABOUT:   Any allergies you have.  All medicines you are taking, including vitamins, herbs, eye drops, creams, and over-the-counter medicines.  Use of steroids (by mouth or creams).  Previous problems you or members of your family have had with the use of anesthetics.  Any blood disorders you have.  Previous surgeries you have had.  Medical conditions you have.  Possibility of pregnancy, if this applies.  Use of cigarettes, alcohol, or illegal drugs. RISKS AND COMPLICATIONS Generally, this is a safe procedure. However, as with any procedure, problems can occur. Possible problems include:  Oversedation.  Trouble breathing on your own. You may need to have a breathing tube until you are awake and breathing on your own.  Allergic reaction to any of the medicines used for the procedure. BEFORE THE PROCEDURE  You may have blood tests done. These tests can help show how well your kidneys and liver are working. They can also show how well your blood clots.  A physical exam will be done.  Only take medicines as directed by your health care provider. You may need to stop taking medicines (such as blood thinners, aspirin, or nonsteroidal anti-inflammatory drugs)  before the procedure.   Do not eat or drink at least 6 hours before the procedure or as directed by your health care provider.  Arrange for a responsible adult, family member, or friend to take you home after the procedure. He or she should stay with you for at least 24 hours after the procedure, until the medicine has worn off. PROCEDURE   An intravenous (IV) catheter will be inserted into one of your veins. Medicine will be able to flow directly into your body through this catheter. You may be given medicine through this tube to help prevent pain and help you relax.  The medical or dental procedure will be done. AFTER THE PROCEDURE  You will stay in a recovery area until the medicine has worn off. Your blood pressure and pulse will be checked.   Depending on the procedure you had, you may be allowed to go home when you can tolerate liquids and your pain is under control.   This information is not intended to replace advice given to you by your health care provider. Make sure you discuss any questions you have with your health care provider.   Document Released: 12/26/2000 Document Revised:  04/23/2014 Document Reviewed: 12/08/2012 Elsevier Interactive Patient Education [employer].

## 2015-03-28 NOTE — Interval H&P Note (Signed)
History and Physical Interval Note:  03/28/2015 8:14 AM  Bridget Phelps  has presented today for surgery, with the diagnosis of ASD  The various methods of treatment have been discussed with the patient and family. After consideration of risks, benefits and other options for treatment, the patient has consented to  Procedure(s): TRANSESOPHAGEAL ECHOCARDIOGRAM (TEE) (N/A) as a surgical intervention .  The patient's history has been reviewed, patient examined, no change in status, stable for surgery.  I have reviewed the patient's chart and labs.  Questions were answered to the patient's satisfaction.     [name]

## 2015-03-28 NOTE — Progress Notes (Signed)
  Echocardiogram Echocardiogram Transesophageal has been performed.  Johny Chess 03/28/2015, 9:46 AM

## 2015-03-28 NOTE — CV Procedure (Signed)
TEE: Under moderate sedation, TEE was performed without complications: LV: Normal. Normal EF. RV: Normal LA: Normal. Left atrial appendage: Normal without thrombus, small. Normal function. Inter atrial septum shows a very small PFO with  Double contrast study weakly positive  for atrial level shunting. No late appearance of bubbles. RA: Normal  MV: Normal Trace MR. TV: Normal Trace TR AV: Normal. No AI or AS. PV: Normal. Trace PI.  Thoracic and ascending aorta: Minimal arch atheromatous changes.

## 2015-03-29 ENCOUNTER — Encounter (HOSPITAL_COMMUNITY): Payer: Self-pay | Admitting: Cardiology

## 2015-04-29 ENCOUNTER — Ambulatory Visit (INDEPENDENT_AMBULATORY_CARE_PROVIDER_SITE_OTHER): Payer: Medicare HMO | Admitting: Neurology

## 2015-04-29 ENCOUNTER — Telehealth: Payer: Self-pay

## 2015-04-29 ENCOUNTER — Encounter: Payer: Self-pay | Admitting: Neurology

## 2015-04-29 VITALS — BP 158/81 | HR 81 | Resp 20 | Ht 62.5 in | Wt 177.0 lb

## 2015-04-29 DIAGNOSIS — G441 Vascular headache, not elsewhere classified: Secondary | ICD-10-CM | POA: Diagnosis not present

## 2015-04-29 DIAGNOSIS — R519 Headache, unspecified: Secondary | ICD-10-CM | POA: Insufficient documentation

## 2015-04-29 DIAGNOSIS — R51 Headache: Secondary | ICD-10-CM

## 2015-04-29 MED ORDER — METOPROLOL SUCCINATE ER 25 MG PO TB24: 25.0000 mg | ORAL_TABLET | Freq: Two times a day (BID) | ORAL | Status: DC

## 2015-04-29 NOTE — Telephone Encounter (Signed)
3 tablets of xanax 0.5 mg given to pt for her MRI. Pt advised to bring a driver to her MRI.

## 2015-04-29 NOTE — Progress Notes (Signed)
PATIENT: Bridget Phelps DOB: 1950-11-29  Chief Complaint  Patient presents with  . New Patient (Initial Visit)    whenever her HR elevates she gets a severe headache and neck pain, she had a stress test on a treadmill but the headache never came, doesn't take any medications for it, happens only when HR elevates, rm 5, with husband     HISTORICAL  Bridget Phelps is 65 years old right-handed female, accompanied by her husband, seen in refer by her primary care Dr. Tollie Pizza for evaluation of headache associated with exertion.  I have reviewed and summarized her primary care office note, she had a history of hypertension, GERD, Echocardiogram March 08 2015, normal left ventricular size, ejection fraction 59%, left atrial cavity is moderately dilated, aneurysmal atrial septum with bidirectional bowing, large fenestrated atrial septal defect with color-flow suggestive of bidirectional shunting, right ventricular cavity is mildly dilated, mild concentric hypertrophy of right ventricle TEE March 28 2015, very small PFO with mildly positive double contrast study for right-to-left shunting, no other significant abnormality,  Treadmill exercise using Bruce protocol, completing 7.44 minutes showed no significant abnormality, no symptoms were triggered  Myocardial perfusion imaging is normal,  Patient reported around 2015, while dancing with increased heart rate, she had her first episode of sudden onset occipital area severe pounding headaches, she has to sit down for 5 to 10 minutes for her headache to gradually subsided, recent few months, she started to ride bikes, on even terrain, she has no symptoms, as soon as she began to ride on the inclined slope, she felt occipital area headache, she has to stop, in between the episode, she denies visual change, lateralized motor or sensory deficits.  In 2002, she woke up in the middle of the night, noticed severe headaches passing out afterward, had  extensive evaluation, I reviewed the record, MRI of the brain, MRI of the brain and neck was normal  REVIEW OF SYSTEMS: Full 14 system review of systems performed and notable only for headaches, snoring, feeling cold, ringing ears, trouble swallowing, moles  ALLERGIES: Allergies  Allergen Reactions  . Sulfa Antibiotics Rash    HOME MEDICATIONS: Current Outpatient Prescriptions  Medication Sig Dispense Refill  . aspirin 81 MG tablet Take 81 mg by mouth daily.    Bridget Phelps 1000 MG TABS Take by mouth.    . Coenzyme Q10 (CO Q 10) 100 MG CAPS Take by mouth.    Bridget Phelps 300 MG CAPS Take 1 capsule by mouth daily.    Marland Kitchen losartan-hydrochlorothiazide (HYZAAR) 50-12.5 MG tablet Take 1 tablet by mouth daily.    Marland Kitchen omeprazole (PRILOSEC) 20 MG capsule Take 1 capsule (20 mg total) by mouth daily. 90 capsule 3  . Red Yeast Rice Extract 600 MG CAPS Take by mouth 2 (two) times daily.     No current facility-administered medications for this visit.    PAST MEDICAL HISTORY: Past Medical History  Diagnosis Date  . Hypercholesterolemia     no Rx meds  . Hypertension     controlled on lisinopril/hctz  . Tubular adenoma of colon 07/2011  . GERD (gastroesophageal reflux disease)   . Sleep apnea     does not have cpap    PAST SURGICAL HISTORY: Past Surgical History  Procedure Laterality Date  . Bunionectomy Bilateral 2012  . Abdominal hysterectomy  1991  . Wisdom tooth extraction  1982  . Tee without cardioversion N/A 03/28/2015    Procedure: TRANSESOPHAGEAL ECHOCARDIOGRAM (TEE);  Surgeon: [name], MD;  Location: [facility] ENDOSCOPY;  Service: Cardiovascular;  Laterality: N/A;    FAMILY HISTORY: Family History  Problem Relation Age of Onset  . Colon cancer Maternal Aunt 17  . Stroke Mother   . Diabetes Mother   . CAD Father   . Stroke Father   . Diabetes Sister   . Heart attack Sister   . Stroke Maternal Grandfather   . Diabetes Paternal Grandmother   . Diabetes Maternal Grandmother     . Colon cancer Maternal Grandmother     greatgrand  . Esophageal cancer Neg Hx   . Stomach cancer Neg Hx   . Rectal cancer Neg Hx     SOCIAL HISTORY:  Social History   Social History  . Marital Status: Married    Spouse Name: N/A  . Number of Children: 4  . Years of Education: N/A   Occupational History  . retired    Social History Main Topics  . Smoking status: Never Smoker   . Smokeless tobacco: Never Used  . Alcohol Use: 4.2 oz/week    7 Glasses of wine per week  . Drug Use: No  . Sexual Activity: Not on file   Other Topics Concern  . Not on file   Social History Narrative   Denies caffeine use.     PHYSICAL EXAM   Filed Vitals:   04/29/15 0750  BP: 158/81  Pulse: 81  Resp: 20  Height: 5' 2.5" (1.588 m)  Weight: 177 lb (80.287 kg)    Not recorded      Body mass index is 31.84 kg/(m^2).  PHYSICAL EXAMNIATION:  Gen: NAD, conversant, well nourised, obese, well groomed                     Cardiovascular: Regular rate rhythm, no peripheral edema, warm, nontender. Eyes: Conjunctivae clear without exudates or hemorrhage Neck: Supple, no carotid bruise. Pulmonary: Clear to auscultation bilaterally   NEUROLOGICAL EXAM:  MENTAL STATUS: Speech:    Speech is normal; fluent and spontaneous with normal comprehension.  Cognition:     Orientation to time, place and person     Normal recent and remote memory     Normal Attention span and concentration     Normal Language, naming, repeating,spontaneous speech     Fund of knowledge   CRANIAL NERVES: CN II: Visual fields are full to confrontation. Fundoscopic exam is normal with sharp discs and no vascular changes. Pupils are round equal and briskly reactive to light. CN III, IV, VI: extraocular movement are normal. No ptosis. CN V: Facial sensation is intact to pinprick in all 3 divisions bilaterally. Corneal responses are intact.  CN VII: Face is symmetric with normal eye closure and smile. CN VIII:  Hearing is normal to rubbing fingers CN IX, X: Palate elevates symmetrically. Phonation is normal. CN XI: Head turning and shoulder shrug are intact CN XII: Tongue is midline with normal movements and no atrophy.  MOTOR: There is no pronator drift of out-stretched arms. Muscle bulk and tone are normal. Muscle strength is normal.  REFLEXES: Reflexes are 2+ and symmetric at the biceps, triceps, knees, and ankles. Plantar responses are flexor.  SENSORY: Intact to light touch, pinprick, position sense, and vibration sense are intact in fingers and toes.  COORDINATION: Rapid alternating movements and fine finger movements are intact. There is no dysmetria on finger-to-nose and heel-knee-shin.    GAIT/STANCE: Posture is normal. Gait is steady with normal steps, base, arm  swing, and turning. Heel and toe walking are normal. Tandem gait is normal.  Romberg is absent.   DIAGNOSTIC DATA (LABS, IMAGING, TESTING) - I reviewed patient records, labs, notes, testing and imaging myself where available.   ASSESSMENT AND PLAN  Jourdyn B Valladares is a 65 y.o. female    Exertion induced headaches  Need to rule out structural lesion, proceed with MRI MRA of brain  Metoprolol 25 mg twice a day  Return to clinic in 1-2 months     Marcial Pacas, M.D. Ph.D.  [facility] [address] [place], [place] [address] Ph: [phone] Fax: [phone]  CC: Stephens Shire, MD

## 2015-05-02 ENCOUNTER — Other Ambulatory Visit: Payer: Self-pay | Admitting: Neurology

## 2015-05-06 ENCOUNTER — Ambulatory Visit
Admission: RE | Admit: 2015-05-06 | Discharge: 2015-05-06 | Disposition: A | Discharge status: Home / Self Care | Payer: Medicare HMO | Source: Ambulatory Visit | Attending: Neurology | Admitting: Neurology

## 2015-05-06 ENCOUNTER — Telehealth: Payer: Self-pay | Admitting: Neurology

## 2015-05-06 DIAGNOSIS — G441 Vascular headache, not elsewhere classified: Secondary | ICD-10-CM

## 2015-05-06 NOTE — Telephone Encounter (Signed)
I have called her, MRI brain showed mild small vessel disease, MRA brain was normal   IMPRESSION: This MRI of the brain without contrast shows the following: 1. Scattered T2/FLAIR hyperintense foci, predominantly in the subcortical white matter. This is a nonspecific finding and could be idiopathic or due to chronic microvascular ischemic change or migraine.  2. There are no acute findings

## 2015-06-03 ENCOUNTER — Ambulatory Visit: Payer: Medicare HMO | Admitting: Pulmonary Disease

## 2015-06-13 ENCOUNTER — Ambulatory Visit: Payer: Medicare HMO | Admitting: Neurology

## 2015-07-20 ENCOUNTER — Ambulatory Visit
Admission: RE | Admit: 2015-07-20 | Discharge: 2015-07-20 | Disposition: A | Discharge status: Home / Self Care | Payer: Medicare HMO | Source: Ambulatory Visit | Attending: Family Medicine | Admitting: Family Medicine

## 2015-07-20 ENCOUNTER — Other Ambulatory Visit: Payer: Self-pay | Admitting: Family Medicine

## 2015-07-20 DIAGNOSIS — R51 Headache: Principal | ICD-10-CM

## 2015-07-20 DIAGNOSIS — G8929 Other chronic pain: Secondary | ICD-10-CM

## 2015-07-20 DIAGNOSIS — R519 Headache, unspecified: Secondary | ICD-10-CM

## 2015-07-20 IMAGING — US US CAROTID DUPLEX BILAT
1 series · 13 of 24 positions shown · non-contrast
Comparison: No prior duplex.  Prior angiogram [DATE]

CLINICAL DATA: 64-year-old female with a history of chronic
headaches.

Cardiovascular risk factors include hypertension.
EXAM:
BILATERAL CAROTID DUPLEX ULTRASOUND
TECHNIQUE: Gray scale imaging, color Doppler and duplex ultrasound were
performed of bilateral carotid and vertebral arteries in the neck.

[Series 1: us carotid duplex bilat · 0.08mm/px · 13 of 74 slices shown]
[im 1/74]
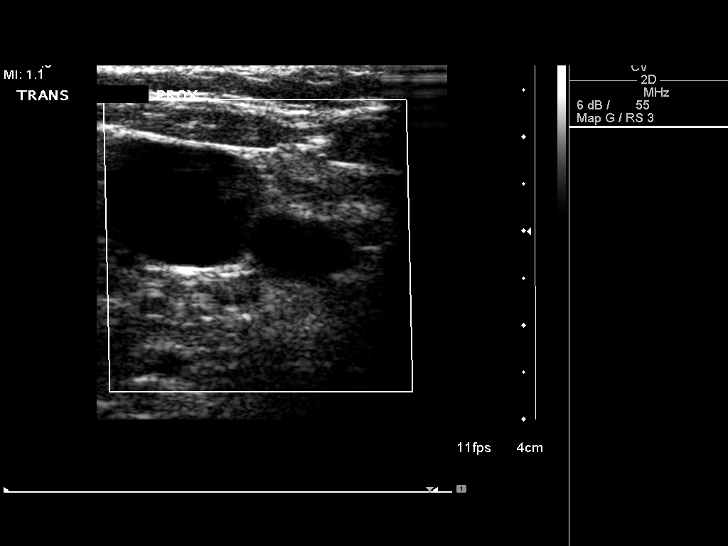
[im 7/74]
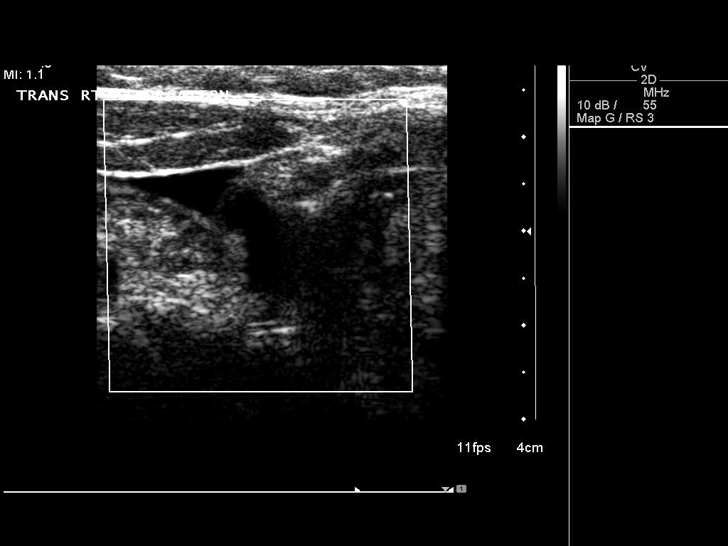
[im 13/74]
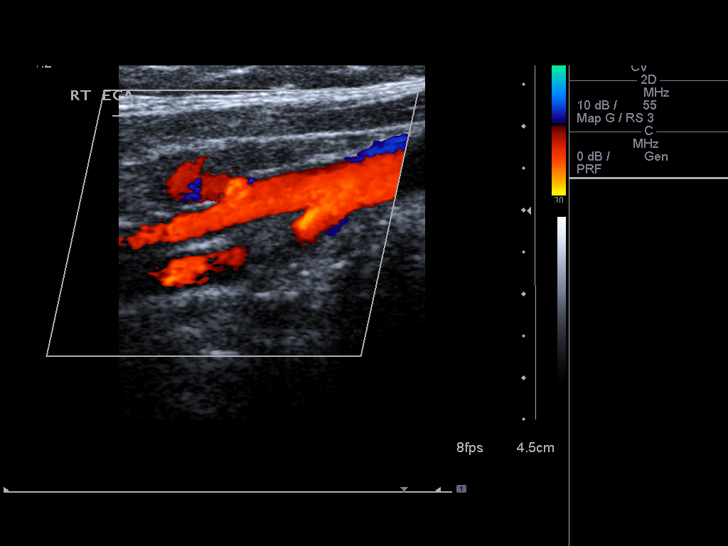
[im 20/74]
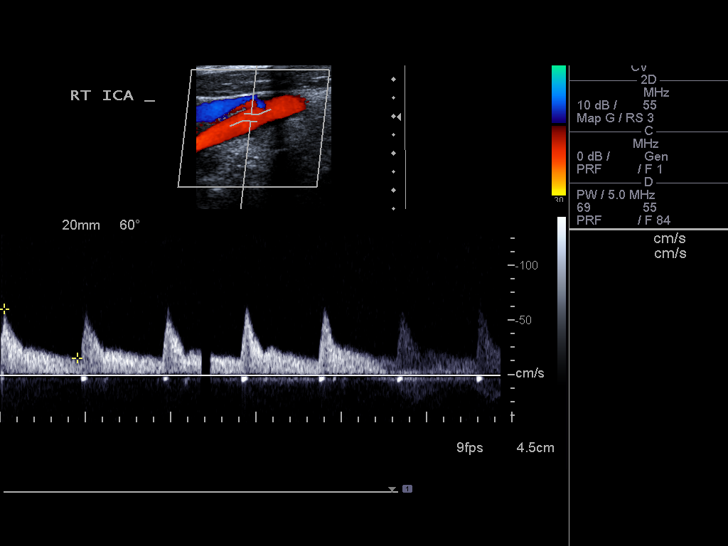
[im 26/74]
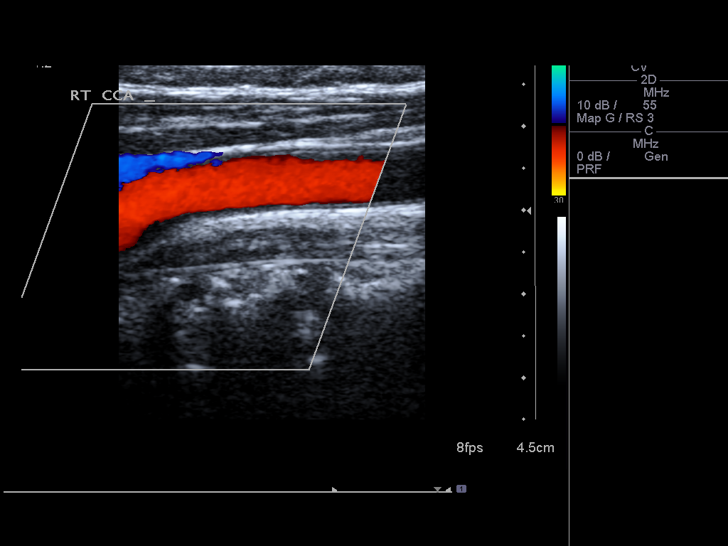
[im 32/74]
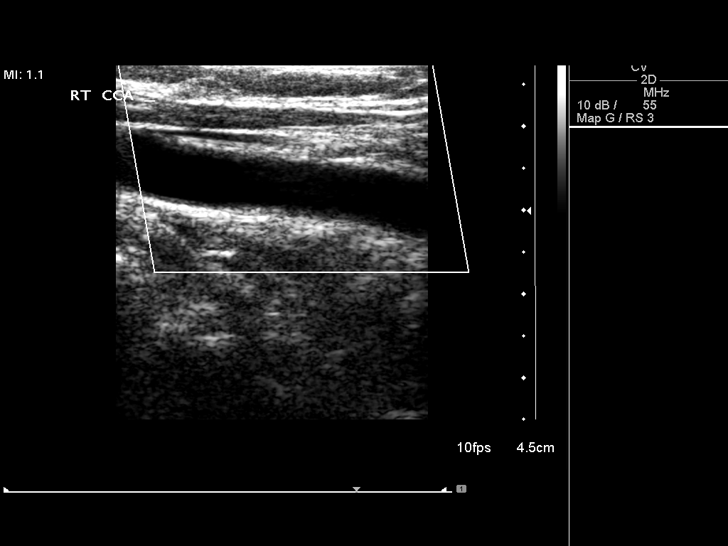
[im 39/74]
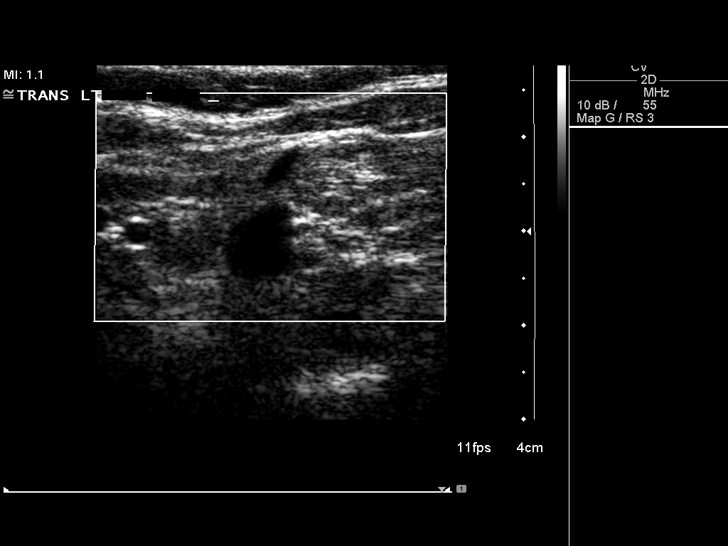
[im 42/74]
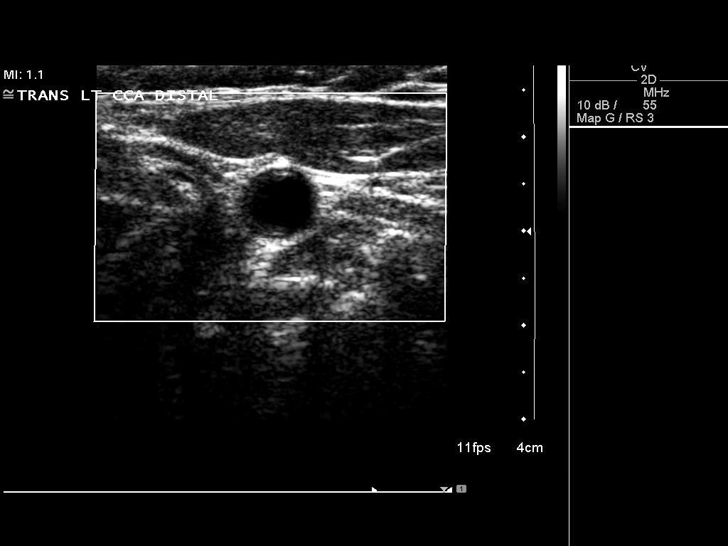
[im 48/74]
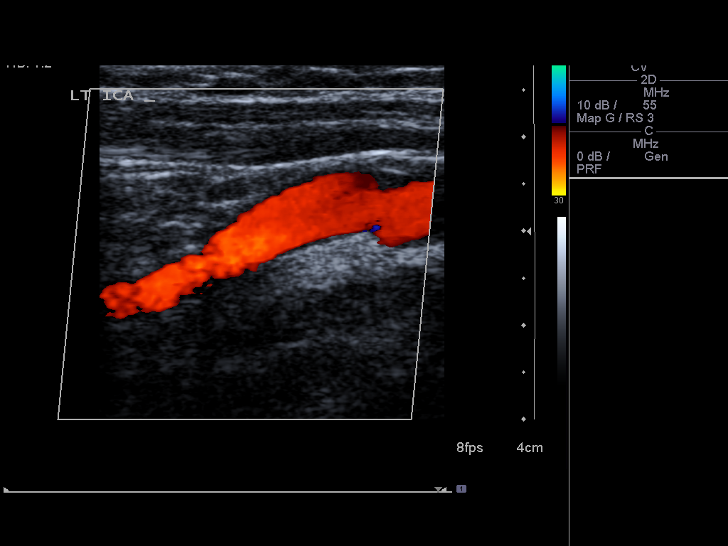
[im 54/74]
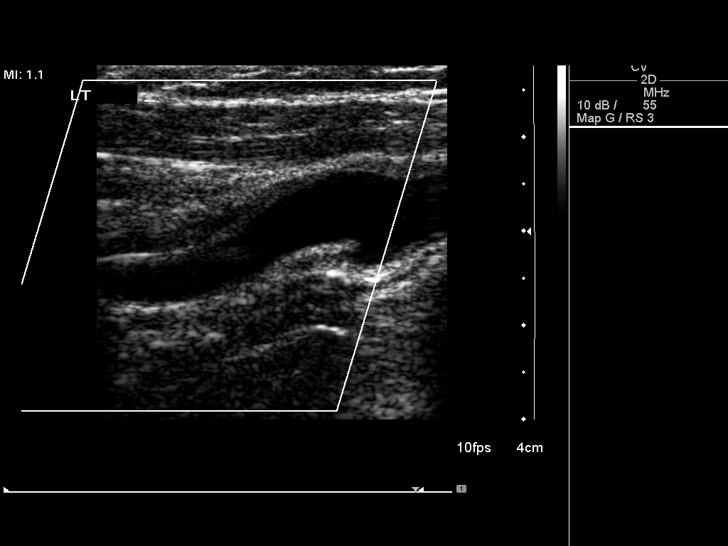
[im 61/74]
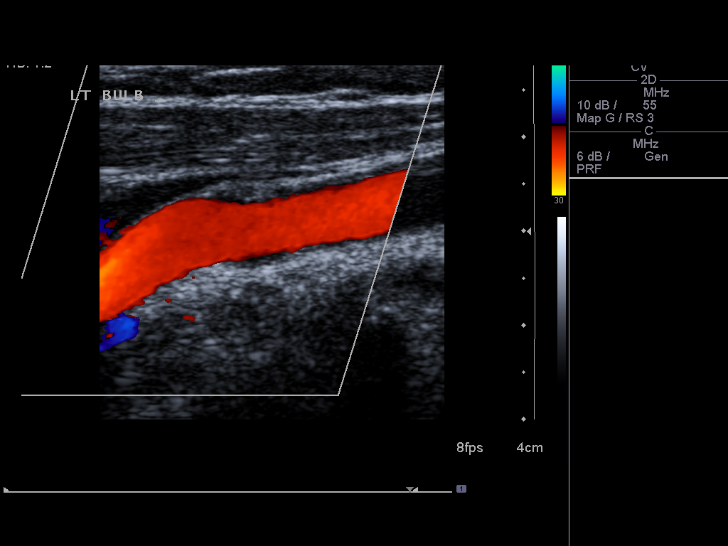
[im 67/74]
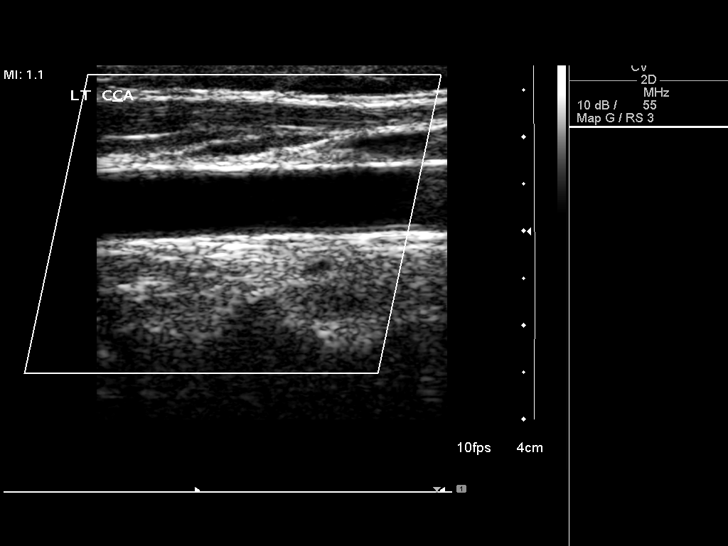
[im 74/74]
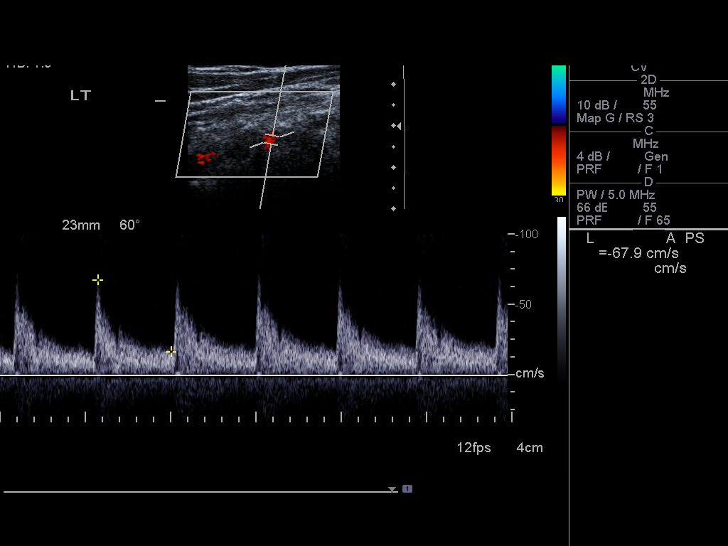

[13 of 24 positions shown; findings below may reference images not displayed]

FINDINGS: Criteria: Quantification of carotid stenosis is based on velocity
parameters that correlate the residual internal carotid diameter
with NASCET-based stenosis levels, using the diameter of the distal
internal carotid lumen as the denominator for stenosis measurement.

The following velocity measurements were obtained:

RIGHT

ICA:  Systolic 94 cm/sec, Diastolic 31 cm/sec

CCA:  114 cm/sec

SYSTOLIC ICA/CCA RATIO:

ECA:  94 cm/sec

LEFT

ICA:  Systolic 137 cm/sec, Diastolic 46 cm/sec

CCA:  107 cm/sec

SYSTOLIC ICA/CCA RATIO:

ECA:  112 cm/sec

Right Brachial SBP: 122

Left Brachial SBP: 126

RIGHT CAROTID ARTERY: No significant calcified disease of the right
common carotid artery. Intermediate waveform maintained.
Heterogeneous plaque without significant calcifications at the right
carotid bifurcation. Low resistance waveform of the right ICA. No
significant tortuosity.

RIGHT VERTEBRAL ARTERY: Antegrade flow with low resistance waveform.

LEFT CAROTID ARTERY: No significant calcified disease of the left
common carotid artery. Intermediate waveform maintained.
Heterogeneous plaque at the left carotid bifurcation without
significant calcifications. Low resistance waveform of the left ICA.

LEFT VERTEBRAL ARTERY:  Antegrade flow with low resistance waveform.
IMPRESSION: Color duplex indicates minimal heterogeneous plaque, with no
hemodynamically significant stenosis by duplex criteria in the
extracranial cerebrovascular circulation.

## 2015-09-27 ENCOUNTER — Encounter (HOSPITAL_COMMUNITY): Payer: Self-pay | Admitting: Physical Therapy

## 2015-09-27 ENCOUNTER — Ambulatory Visit (HOSPITAL_COMMUNITY): Payer: Medicare HMO | Attending: Neurology | Admitting: Physical Therapy

## 2015-09-27 DIAGNOSIS — R29898 Other symptoms and signs involving the musculoskeletal system: Secondary | ICD-10-CM

## 2015-09-27 DIAGNOSIS — M542 Cervicalgia: Secondary | ICD-10-CM | POA: Diagnosis present

## 2015-09-28 NOTE — Therapy (Addendum)
[place], [place], [address] Phone: [phone]   Fax:  [phone]  Physical Therapy Evaluation  Patient Details  Name: Bridget Phelps MRN: [id number] Date of Birth: 04-Aug-1950 Referring Provider: Zoila Shutter, MD  Encounter Date: 09/27/2015      PT End of Session - 09/28/15 0752    Visit Number 1   Number of Visits 3   Date for PT Re-Evaluation 10/28/15   Authorization Type [employer]   Authorization Time Period 09/27/15 to 10/28/15   PT Start Time 1604   PT Stop Time 1645   PT Time Calculation (min) 41 min   Activity Tolerance Patient tolerated treatment well   Behavior During Therapy [facility] for tasks assessed/performed      Past Medical History  Diagnosis Date  . Hypercholesterolemia     no Rx meds  . Hypertension     controlled on lisinopril/hctz  . Tubular adenoma of colon 07/2011  . GERD (gastroesophageal reflux disease)   . Sleep apnea     does not have cpap    Past Surgical History  Procedure Laterality Date  . Bunionectomy Bilateral 2012  . Abdominal hysterectomy  1991  . Wisdom tooth extraction  1982  . Tee without cardioversion N/A 03/28/2015    Procedure: TRANSESOPHAGEAL ECHOCARDIOGRAM (TEE);  Surgeon: [name], MD;  Location: [facility] ENDOSCOPY;  Service: Cardiovascular;  Laterality: N/A;    There were no vitals filed for this visit.       Subjective Assessment - 09/27/15 1606    Subjective Pt reports ~2 years ago when she noticed severe pain at the base of her skull when she was biking up a hill. She took ibuprofen and it went away. She noticed it again several months later with strenuous activity. She has trouble looking up for long periods of time and with prolonged neck rotation where she notices headaches. She does have dizziness with prolonged neck extension. She notes that her headaches come with activity and go away when she relaxes. She went to the chiropractor >1 year ago and  they worked on her neck. She noticed it helped some but didn't last long. She denies any numbness and tingling down her arms.     Pertinent History Gerd, HTN   Limitations --  strenuous acitivty   How long can you sit comfortably? unlimited    How long can you stand comfortably? unlimited   How long can you walk comfortably? unlimited   Diagnostic tests MRI: herniated disc   Patient Stated Goals decrease neck pain    Currently in Pain? No/denies            [facility] PT Assessment - 09/28/15 0001    Assessment   Medical Diagnosis Neck pain   Referring Provider Zoila Shutter, MD   Onset Date/Surgical Date --  ~2 years ago   Next MD Visit next month   Prior Therapy None   Precautions   Precautions None   Restrictions   Weight Bearing Restrictions No   Balance Screen   Has the patient fallen in the past 6 months No   Has the patient had a decrease in activity level because of a fear of falling?  No   Is the patient reluctant to leave their home because of a fear of falling?  No   Home Ecologist residence   Prior Function   Level of Independence Independent   Cognition   Overall  Cognitive Status Within Functional Limits for tasks assessed   Observation/Other Assessments   Focus on Therapeutic Outcomes (FOTO)  50% limited   Sensation   Light Touch Appears Intact   AROM   Cervical Flexion WNL   Cervical Extension WNL  pain end range mid cervical   Cervical - Right Side Bend 45 deg, pull Lt UT   Cervical - Left Side Bend 45 deg, pull Rt UT   Cervical - Right Rotation 55 deg, pain free   Cervical - Left Rotation 55 deg, pain free   Strength   Cervical Flexion 5/5   Cervical Extension 5/5  pain noted   Cervical - Right Side Bend 5/5   Cervical - Left Side Bend 5/5   Cervical - Right Rotation 5/5   Cervical - Left Rotation 5/5   Palpation   Palpation comment Rt sub occipitals, Rt upper trap   Special Tests    Special Tests Cervical    Cervical Tests Dictraction;Vertebral Artery Test   Distraction Test   Findngs Negative   Vertebral Artery Test    Findings Negative   Comment pain report on Rt mid cervical region, denies headache/dizziness                   [facility] Adult PT Treatment/Exercise - 09/28/15 0001    Exercises   Exercises Neck   Neck Exercises: Seated   Neck Retraction 5 reps   Neck Exercises: Stretches   Upper Trapezius Stretch 1 rep;30 seconds   Levator Stretch 1 rep;30 seconds                PT Education - 09/28/15 0751    Education provided Yes   Education Details eval findings/POC; implemented/reviewed HEP technique   Person(s) Educated Patient   Methods Explanation;Demonstration;Handout   Comprehension Verbalized understanding;Returned demonstration          PT Short Term Goals - 09/28/15 0756    PT SHORT TERM GOAL #1   Title Pt will demo consistency and independence with her HEP   Time 2   Period Weeks   Status New   PT SHORT TERM GOAL #2   Title Pt will demo pain free cervical rotation in all directions to improve her tolerance to daily activity and exercise.   Time 2   Period Weeks   Status New           PT Long Term Goals - 09/28/15 0757    PT LONG TERM GOAL #1   Title Pt will demo improved deep neck flexor endurance evident by her ability to maintain neck flexion hold for atleast 72mn without fatiguing.   Time 4   Period Weeks   Status New   PT LONG TERM GOAL #2   Title pt will report she is able to return to exercise and other strenuous activity without report of neck pain.   Time 4   Period Weeks   Status New               Plan - 09/28/15 0753    Clinical Impression Statement [name] is a pleasant 89yoF referred to OPPT for management of headaches and neck pain of several months. She noted the sharp pain at the base of her skull with strenuous activity which alleviated with rest. She presents today with improved pain, cervical AROM/PROM  within normal limits with end range pain and upper trap pulling reported. She denies numbness/tingling in the UE, reports headache and occasional  dizziness with prolonged positions in extension or rotation, however vertebral artery testing was negative this visit. She does demonstrate limitations in muscle flexibility, deep cervical flexor endurance and noted tenderness/muscle spasm throughout the sub occipital musculature (Rt>Lt). Pt would benefit from skilled PT to address her limitations and improve performance in ADLs/exercise activity. Pt states she is willing to perform HEP consistently at home, so I provided her with her HEP and encouraged her to follow up in 2 weeks to see if any improvement is noted. She was in agreement with this plan.   Rehab Potential Good   PT Frequency Other (comment)  1x every 2 weeks   PT Duration 4 weeks   PT Treatment/Interventions ADLs/Self Care Home Management;Moist Heat;Therapeutic exercise;Therapeutic activities;Neuromuscular re-education;Patient/family education;Manual techniques;Passive range of motion   PT Next Visit Plan manual to address sub occipital restrictions; continue with deep neck flexor endurance therex; follow up regarding pain and HEP with possible d/c if no issues   PT Home Exercise Plan seated chin tucks, seated cervical rotation, upper trap/levator stretch, supine flexion hold   Recommended Other Services none   Consulted and Agree with Plan of Care Patient      Patient will benefit from skilled therapeutic intervention in order to improve the following deficits and impairments:  Decreased mobility, Postural dysfunction, Impaired flexibility, Pain, Increased muscle spasms  Visit Diagnosis: Cervicalgia  Other symptoms and signs involving the musculoskeletal system      G-Codes - 10/11/2015 0806    Functional Assessment Tool Used FOTO: 50%   Functional Limitation Mobility: Walking and moving around   Mobility: Walking and Moving Around  Current Status [phone]) At least 40 percent but less than 60 percent impaired, limited or restricted   Mobility: Walking and Moving Around Goal Status [phone]) At least 20 percent but less than 40 percent impaired, limited or restricted       Problem List Patient Active Problem List   Diagnosis Date Noted  . Headache 04/29/2015  . Morbid obesity ([place]) 02/18/2015  . Angina pectoris ? referred to neck ? 02/15/2015  . OSA (obstructive sleep apnea) 04/06/2013  . Hypercholesterolemia   . Essential hypertension    4:35 PM,2015-10-11 Elly Modena PT, DPT [facility] [place], [place], [address] Phone: [phone]   Fax:  [phone]  Name: Bridget Phelps MRN: [id number] Date of Birth: [birth year]   PHYSICAL THERAPY DISCHARGE SUMMARY  Visits from Start of Care: 1  Current functional level related to goals / functional outcomes: Pt called to report she has been consistently performing her HEP. She has no more symptoms and is doing great. Because of this, she would like to cancel her appointments at this time. She will be discharged from PT secondary to being pleased with her current functional level.     Education / Equipment: HEP provided at initial evaluation  Plan: Patient agrees to discharge.  Patient goals were met. Patient is being discharged due to being pleased with the current functional level.  ?????

## 2015-10-03 ENCOUNTER — Telehealth (HOSPITAL_COMMUNITY): Payer: Self-pay

## 2015-10-03 NOTE — Telephone Encounter (Signed)
10/03/15 pt had 3 appts scheduled and she asked that they be cx - she said she was given exercises for home and they seem to be helping alot

## 2015-10-11 ENCOUNTER — Encounter (HOSPITAL_COMMUNITY): Payer: Medicare HMO | Admitting: Physical Therapy

## 2015-10-21 ENCOUNTER — Encounter (HOSPITAL_COMMUNITY): Payer: Medicare HMO | Admitting: Physical Therapy

## 2015-10-25 ENCOUNTER — Encounter (HOSPITAL_COMMUNITY): Payer: Medicare HMO | Admitting: Physical Therapy

## 2016-05-25 ENCOUNTER — Encounter: Payer: Self-pay | Admitting: Gastroenterology

## 2016-06-19 ENCOUNTER — Encounter: Payer: Self-pay | Admitting: Gastroenterology

## 2016-08-07 ENCOUNTER — Encounter: Payer: Self-pay | Admitting: Gastroenterology

## 2016-08-07 ENCOUNTER — Ambulatory Visit: Payer: Medicare HMO

## 2016-08-07 VITALS — Ht 62.0 in | Wt 169.8 lb

## 2016-08-07 DIAGNOSIS — Z860100 Personal history of colon polyps, unspecified: Secondary | ICD-10-CM

## 2016-08-07 MED ORDER — SUPREP BOWEL PREP KIT 17.5-3.13-1.6 GM/177ML PO SOLN: 1.0000 | Freq: Once | ORAL | 0 refills | Status: AC

## 2016-08-07 NOTE — Progress Notes (Signed)
NO BLOOD No allergies to eggs or soy No diet meds No home oxygen No past problems with anesthesia  Registered for emmi

## 2016-08-21 ENCOUNTER — Encounter: Payer: Self-pay | Admitting: Gastroenterology

## 2016-08-21 ENCOUNTER — Ambulatory Visit (AMBULATORY_SURGERY_CENTER): Payer: Medicare HMO | Admitting: Gastroenterology

## 2016-08-21 VITALS — BP 104/68 | HR 55 | Temp 97.7°F | Resp 13 | Ht 62.0 in | Wt 169.0 lb

## 2016-08-21 DIAGNOSIS — K621 Rectal polyp: Secondary | ICD-10-CM

## 2016-08-21 DIAGNOSIS — Z800 Family history of malignant neoplasm of digestive organs: Secondary | ICD-10-CM | POA: Diagnosis not present

## 2016-08-21 DIAGNOSIS — D125 Benign neoplasm of sigmoid colon: Secondary | ICD-10-CM

## 2016-08-21 DIAGNOSIS — Z86010 Personal history of colonic polyps: Secondary | ICD-10-CM

## 2016-08-21 DIAGNOSIS — Z860101 Personal history of adenomatous and serrated colon polyps: Secondary | ICD-10-CM

## 2016-08-21 DIAGNOSIS — D128 Benign neoplasm of rectum: Secondary | ICD-10-CM

## 2016-08-21 MED ORDER — SODIUM CHLORIDE 0.9 % IV SOLN: 500.0000 mL | INTRAVENOUS | Status: DC

## 2016-08-21 NOTE — Progress Notes (Signed)
Report to PACU, RN, vss, BBS= Clear.  

## 2016-08-21 NOTE — Op Note (Signed)
[place] Patient Name: Bridget Phelps Procedure Date: 08/21/2016 8:39 AM MRN: [id number] Endoscopist: Ladene Artist , MD Age: 66 Referring MD:  Date of Birth: [birth year] Gender: Female Account #: [id number] Procedure:                Colonoscopy Indications:              Surveillance: Personal history of adenomatous                            polyps on last colonoscopy 5 years ago. Family                            history of colon cancer-multiple s2nd degree                            relatives. Medicines:                Monitored Anesthesia Care Procedure:                Pre-Anesthesia Assessment:                           - Prior to the procedure, a History and Physical                            was performed, and patient medications and                            allergies were reviewed. The patient's tolerance of                            previous anesthesia was also reviewed. The risks                            and benefits of the procedure and the sedation                            options and risks were discussed with the patient.                            All questions were answered, and informed consent                            was obtained. Prior Anticoagulants: The patient has                            taken no previous anticoagulant or antiplatelet                            agents. ASA Grade Assessment: II - A patient with                            mild systemic disease. After reviewing the risks  and benefits, the patient was deemed in                            satisfactory condition to undergo the procedure.                           After obtaining informed consent, the colonoscope                            was passed under direct vision. Throughout the                            procedure, the patient's blood pressure, pulse, and                            oxygen saturations were monitored continuously. The              Colonoscope was introduced through the anus and                            advanced to the the cecum, identified by                            appendiceal orifice and ileocecal valve. The                            colonoscopy was performed without difficulty. The                            patient tolerated the procedure well. The quality                            of the bowel preparation was good. The ileocecal                            valve, appendiceal orifice, and rectum were                            photographed. Scope In: 8:51:52 AM Scope Out: 9:08:08 AM Scope Withdrawal Time: 0 hours 11 minutes 49 seconds  Total Procedure Duration: 0 hours 16 minutes 16 seconds  Findings:                 The perianal and digital rectal examinations were                            normal.                           Four sessile polyps were found in the rectum (2)                            and sigmoid colon (2). The polyps were 4 to 6 mm in  size. These polyps were removed with a cold snare.                            Resection and retrieval were complete.                           Multiple medium-mouthed diverticula were found in                            the left colon. There was evidence of diverticular                            spasm. There was no evidence of diverticular                            bleeding.                           Scattered medium-mouthed diverticula were found in                            the transverse colon. There was no evidence of                            diverticular bleeding.                           Internal hemorrhoids were found during                            retroflexion. The hemorrhoids were small and Grade                            I (internal hemorrhoids that do not prolapse).                           The exam was otherwise normal throughout the                            examined colon. Complications:             No immediate complications. Estimated Blood Loss:     Estimated blood loss was minimal. Impression:               - Four 4 to 6 mm polyps in the rectum and in the                            sigmoid colon, removed with a cold snare. Resected                            and retrieved.                           - Moderate diverticulosis in the left colon.                           -  Mild diverticulosis in the transverse colon.                           - Internal hemorrhoids. Recommendation:           - Patient has a contact number available for                            emergencies. The signs and symptoms of potential                            delayed complications were discussed with the                            patient. Return to normal activities tomorrow.                            Written discharge instructions were provided to the                            patient.                           - High fiber diet.                           - Continue present medications.                           - Await pathology results.                           - Repeat colonoscopy in 5 years for surveillance. Ladene Artist, MD 08/21/2016 9:14:47 AM This report has been signed electronically.

## 2016-08-21 NOTE — Patient Instructions (Signed)
YOU HAD AN ENDOSCOPIC PROCEDURE TODAY AT [facility]:   Refer to the procedure report that was given to you for any specific questions about what was found during the examination.  If the procedure report does not answer your questions, please call your gastroenterologist to clarify.  If you requested that your care partner not be given the details of your procedure findings, then the procedure report has been included in a sealed envelope for you to review at your convenience later.  YOU SHOULD EXPECT: Some feelings of bloating in the abdomen. Passage of more gas than usual.  Walking can help get rid of the air that was put into your GI tract during the procedure and reduce the bloating. If you had a lower endoscopy (such as a colonoscopy or flexible sigmoidoscopy) you may notice spotting of blood in your stool or on the toilet paper. If you underwent a bowel prep for your procedure, you may not have a normal bowel movement for a few days.  Please Note:  You might notice some irritation and congestion in your nose or some drainage.  This is from the oxygen used during your procedure.  There is no need for concern and it should clear up in a day or so.  SYMPTOMS TO REPORT IMMEDIATELY:   Following lower endoscopy (colonoscopy or flexible sigmoidoscopy):  Excessive amounts of blood in the stool  Significant tenderness or worsening of abdominal pains  Swelling of the abdomen that is new, acute  Fever of 100F or higher   Following upper endoscopy (EGD)  Vomiting of blood or coffee ground material  New chest pain or pain under the shoulder blades  Painful or persistently difficult swallowing  New shortness of breath  Fever of 100F or higher  Black, tarry-looking stools  For urgent or emergent issues, a gastroenterologist can be reached at any hour by calling [phone].   DIET:  We do recommend a small meal at first, but then you may proceed to your regular diet.  Drink  plenty of fluids but you should avoid alcoholic beverages for 24 hours.  ACTIVITY:  You should plan to take it easy for the rest of today and you should NOT DRIVE or use heavy machinery until tomorrow (because of the sedation medicines used during the test).    FOLLOW UP: Our staff will call the number listed on your records the next business day following your procedure to check on you and address any questions or concerns that you may have regarding the information given to you following your procedure. If we do not reach you, we will leave a message.  However, if you are feeling well and you are not experiencing any problems, there is no need to return our call.  We will assume that you have returned to your regular daily activities without incident.  If any biopsies were taken you will be contacted by phone or by letter within the next 1-3 weeks.  Please call us at [phone] if you have not heard about the biopsies in 3 weeks.    SIGNATURES/CONFIDENTIALITY: You and/or your care partner have signed paperwork which will be entered into your electronic medical record.  These signatures attest to the fact that that the information above on your After Visit Summary has been reviewed and is understood.  Full responsibility of the confidentiality of this discharge information lies with you and/or your care-partner.  Polyp, diverticulosis, high fiber diet and hemorrhoid information given.  Recall  5 years-2023

## 2016-08-21 NOTE — Progress Notes (Signed)
Called to room to assist during endoscopic procedure.  Patient ID and intended procedure confirmed with present staff. Received instructions for my participation in the procedure from the performing physician.  

## 2016-08-22 ENCOUNTER — Telehealth: Payer: Self-pay | Admitting: *Deleted

## 2016-08-22 NOTE — Telephone Encounter (Signed)
  Follow up Call-  Call back number 08/21/2016  Post procedure Call Back phone  # [phone]  Permission to leave phone message Yes  Some recent data might be hidden     Patient questions:  Do you have a fever, pain , or abdominal swelling? No. Pain Score  0 *  Have you tolerated food without any problems? Yes.    Have you been able to return to your normal activities? Yes.    Do you have any questions about your discharge instructions: Diet   No. Medications  No. Follow up visit  No.  Do you have questions or concerns about your Care? No.  Actions: * If pain score is 4 or above: No action needed, pain <4.

## 2016-08-27 ENCOUNTER — Encounter: Payer: Self-pay | Admitting: Gastroenterology

## 2017-01-30 ENCOUNTER — Other Ambulatory Visit: Payer: Self-pay | Admitting: Family Medicine

## 2017-01-30 DIAGNOSIS — Z1231 Encounter for screening mammogram for malignant neoplasm of breast: Secondary | ICD-10-CM

## 2017-02-18 ENCOUNTER — Ambulatory Visit: Payer: Medicare HMO

## 2017-03-05 ENCOUNTER — Ambulatory Visit
Admission: RE | Admit: 2017-03-05 | Discharge: 2017-03-05 | Disposition: A | Discharge status: Home / Self Care | Payer: Medicare HMO | Source: Ambulatory Visit | Attending: Family Medicine | Admitting: Family Medicine

## 2017-03-05 DIAGNOSIS — Z1231 Encounter for screening mammogram for malignant neoplasm of breast: Secondary | ICD-10-CM

## 2018-03-05 ENCOUNTER — Other Ambulatory Visit: Payer: Self-pay | Admitting: Family Medicine

## 2018-03-05 DIAGNOSIS — Z1231 Encounter for screening mammogram for malignant neoplasm of breast: Secondary | ICD-10-CM

## 2018-04-16 HISTORY — PX: CATARACT EXTRACTION: SUR2

## 2018-04-23 ENCOUNTER — Ambulatory Visit: Payer: Medicare HMO

## 2018-05-21 ENCOUNTER — Ambulatory Visit
Admission: RE | Admit: 2018-05-21 | Discharge: 2018-05-21 | Disposition: A | Discharge status: Home / Self Care | Payer: Medicare HMO | Source: Ambulatory Visit | Attending: Family Medicine | Admitting: Family Medicine

## 2018-05-21 DIAGNOSIS — Z1231 Encounter for screening mammogram for malignant neoplasm of breast: Secondary | ICD-10-CM

## 2018-05-21 IMAGING — MG DIGITAL SCREENING BILATERAL MAMMOGRAM WITH TOMO AND CAD
6 of 10 series · 6 of 30 positions shown · non-contrast
Comparison: Previous exam(s).

CLINICAL DATA: Screening.

EXAM:
DIGITAL SCREENING BILATERAL MAMMOGRAM WITH TOMO AND CAD

[R CC synth-2D]
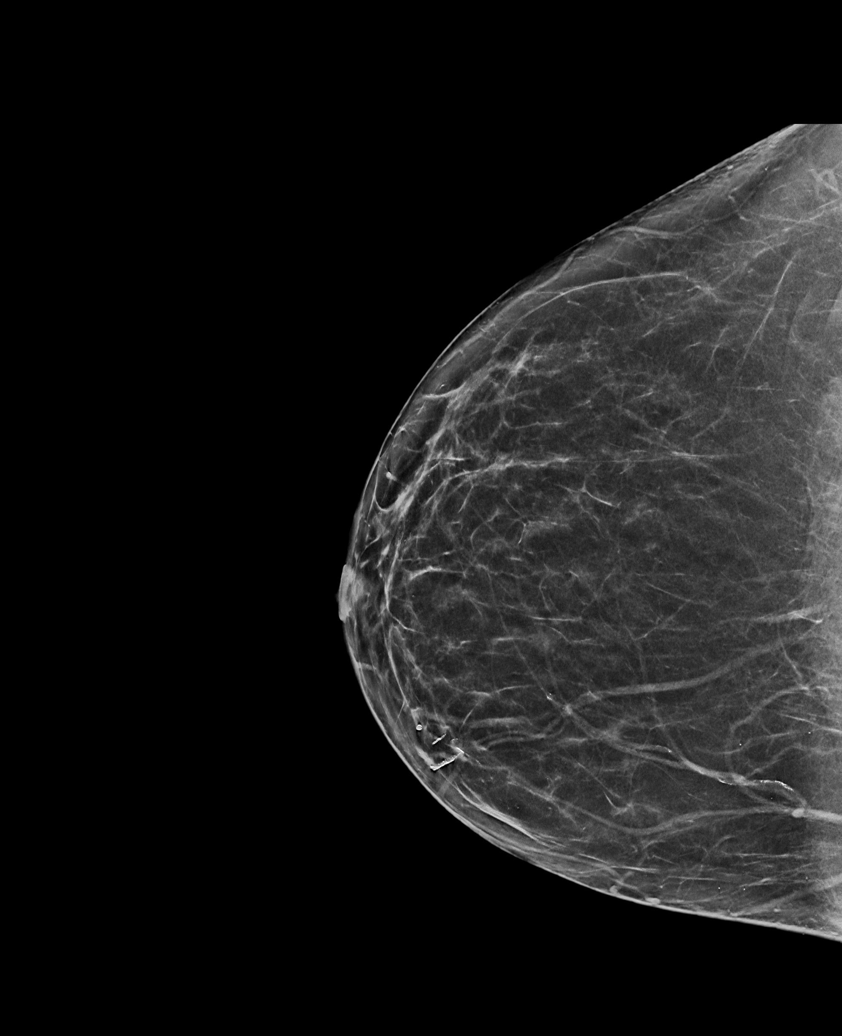

[L MLO synth-2D]
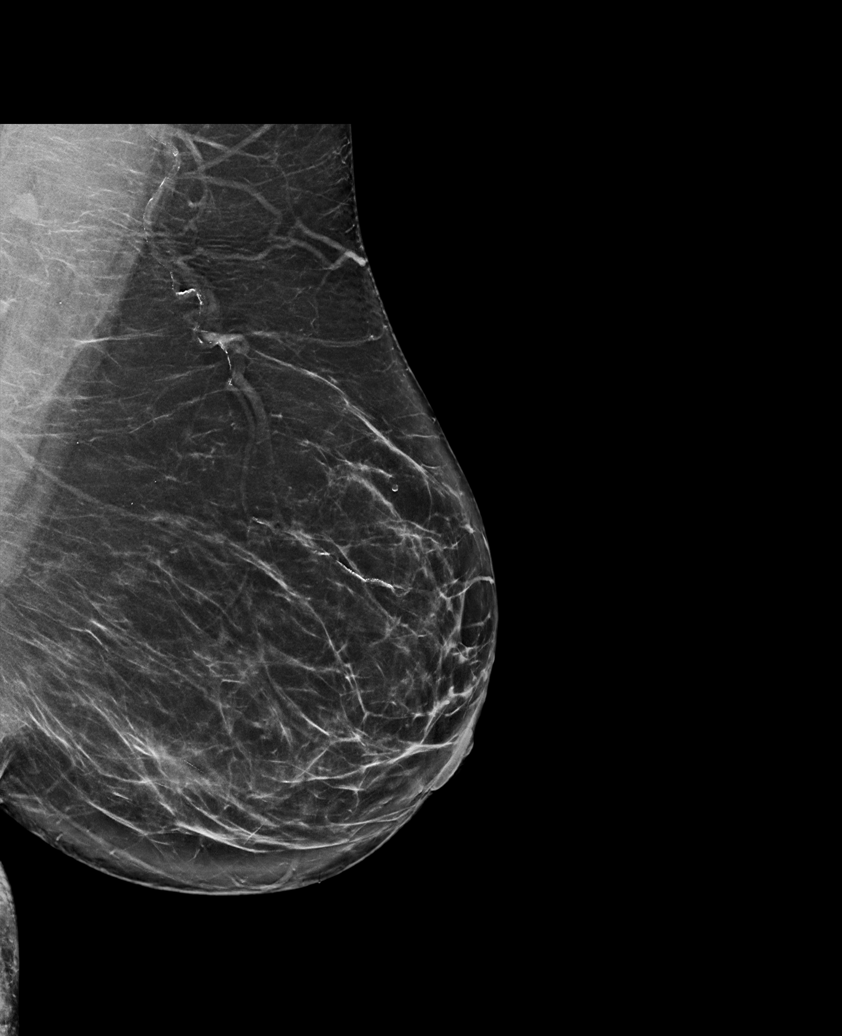

[R MLO synth-2D]
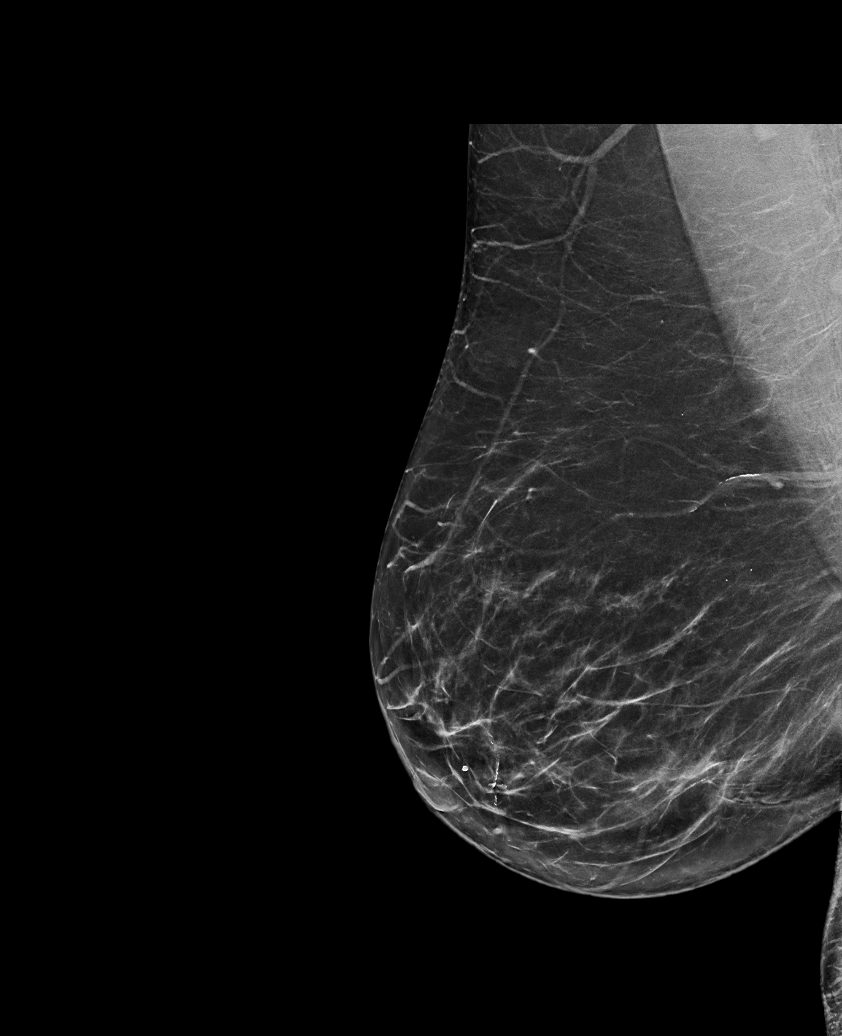

[L CC synth-2D (1 of 2)]
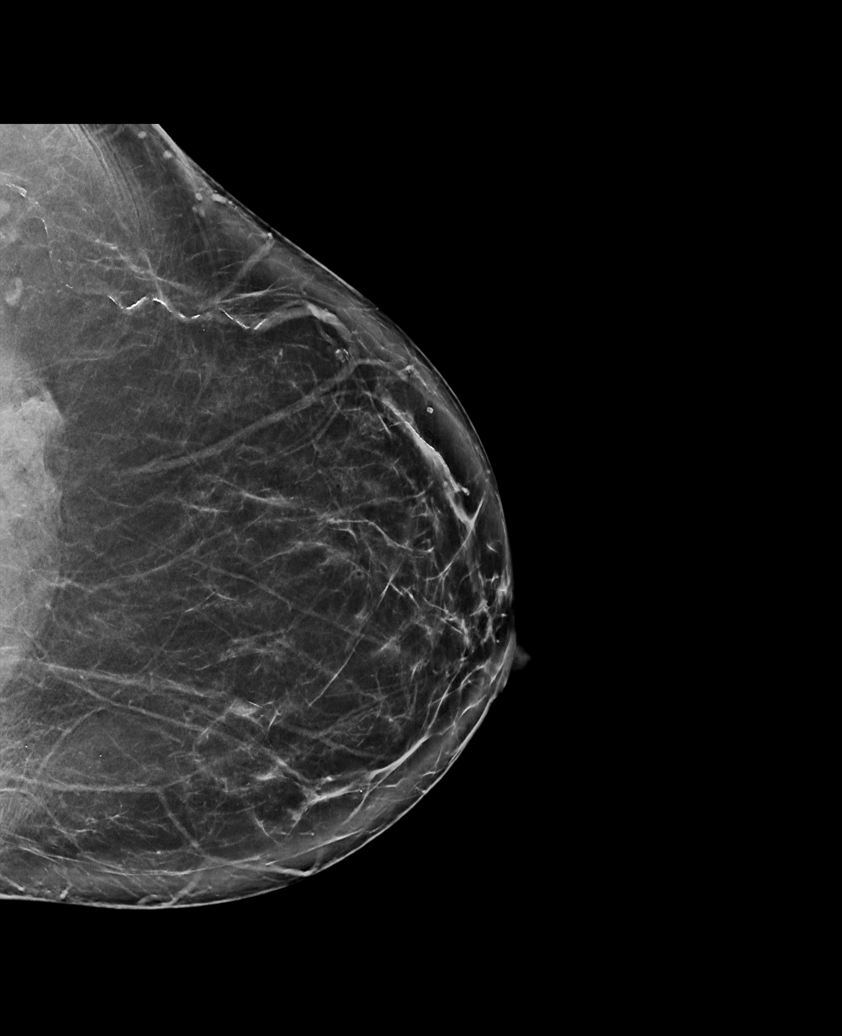

[L CC synth-2D (2 of 2)]
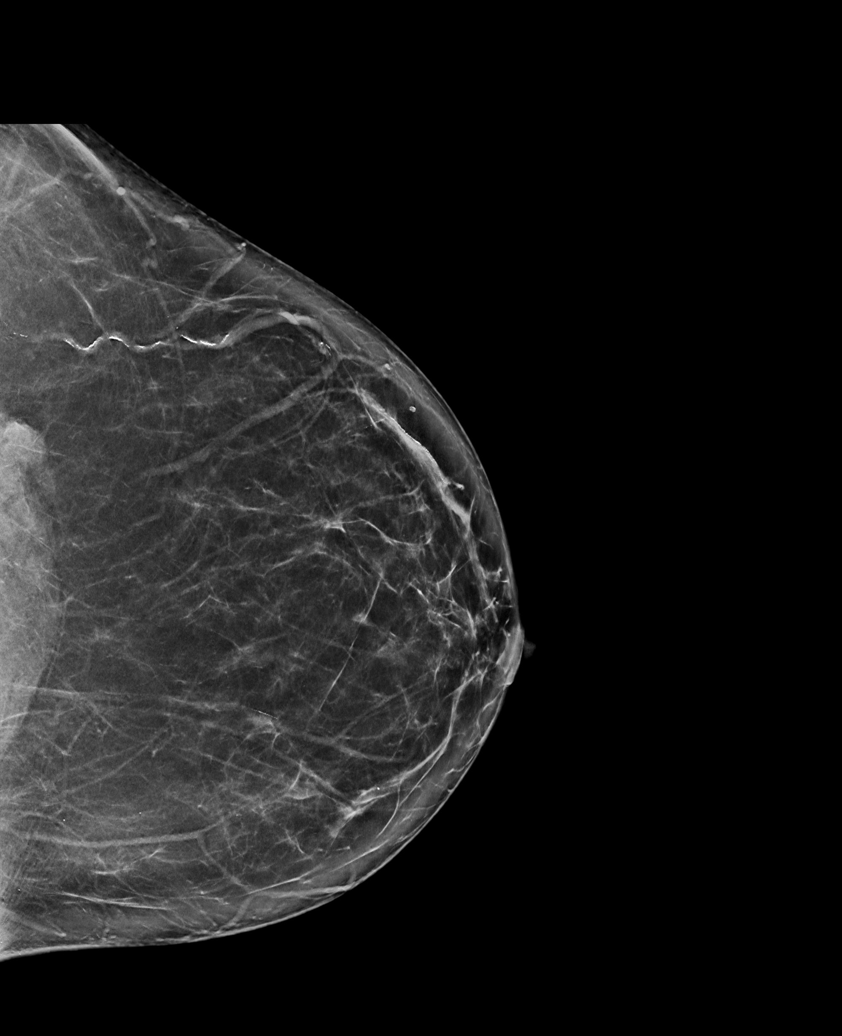

[R MLO tomo · tomo slice 40/79.0]
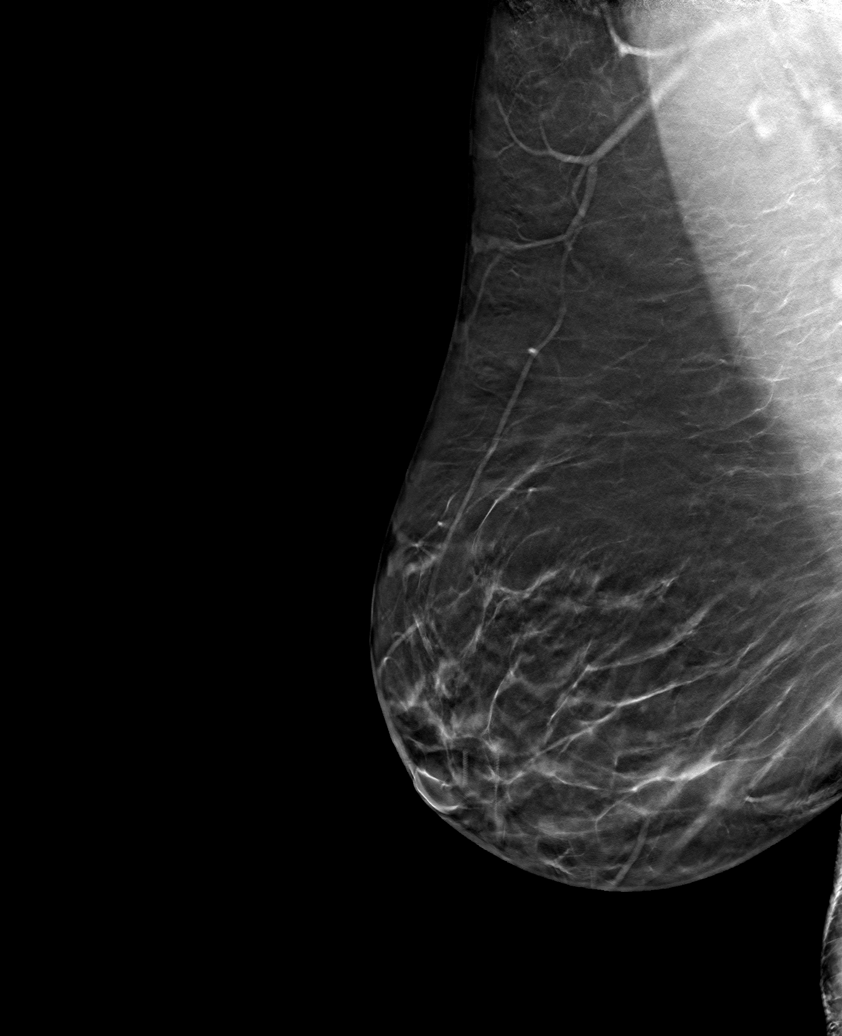

[6 of 30 positions shown; findings below may reference images not displayed]

ACR Breast Density Category b: There are scattered areas of
fibroglandular density.
FINDINGS: In the left breast, a possible asymmetry warrants further
evaluation. In the right breast, no findings suspicious for
malignancy. Images were processed with CAD.
IMPRESSION: Further evaluation is suggested for possible asymmetry in the left
breast.

RECOMMENDATION:
Diagnostic mammogram and possibly ultrasound of the left breast.
(Code:[XJ])

The patient will be contacted regarding the findings, and additional
imaging will be scheduled.

BI-RADS CATEGORY  0: Incomplete. Need additional imaging evaluation
and/or prior mammograms for comparison.

## 2018-05-22 ENCOUNTER — Other Ambulatory Visit: Payer: Self-pay | Admitting: Family Medicine

## 2018-05-22 DIAGNOSIS — R928 Other abnormal and inconclusive findings on diagnostic imaging of breast: Secondary | ICD-10-CM

## 2018-05-27 ENCOUNTER — Ambulatory Visit
Admission: RE | Admit: 2018-05-27 | Discharge: 2018-05-27 | Disposition: A | Discharge status: Home / Self Care | Payer: Medicare HMO | Source: Ambulatory Visit | Attending: Family Medicine | Admitting: Family Medicine

## 2018-05-27 ENCOUNTER — Other Ambulatory Visit: Payer: Self-pay | Admitting: Family Medicine

## 2018-05-27 DIAGNOSIS — R928 Other abnormal and inconclusive findings on diagnostic imaging of breast: Secondary | ICD-10-CM

## 2018-05-27 DIAGNOSIS — N6489 Other specified disorders of breast: Secondary | ICD-10-CM

## 2018-05-27 IMAGING — MG DIGITAL DIAGNOSTIC UNILATERAL LEFT MAMMOGRAM WITH TOMO AND CAD
4 series · 4 of 12 positions shown · non-contrast
Comparison: Previous exam(s).

CLINICAL DATA: 67-year-old female for evaluation of possible LEFT
breast mass on screening mammogram

EXAM:
DIGITAL DIAGNOSTIC LEFT MAMMOGRAM WITH CAD AND TOMO
ULTRASOUND LEFT BREAST

[L LM synth-2D]
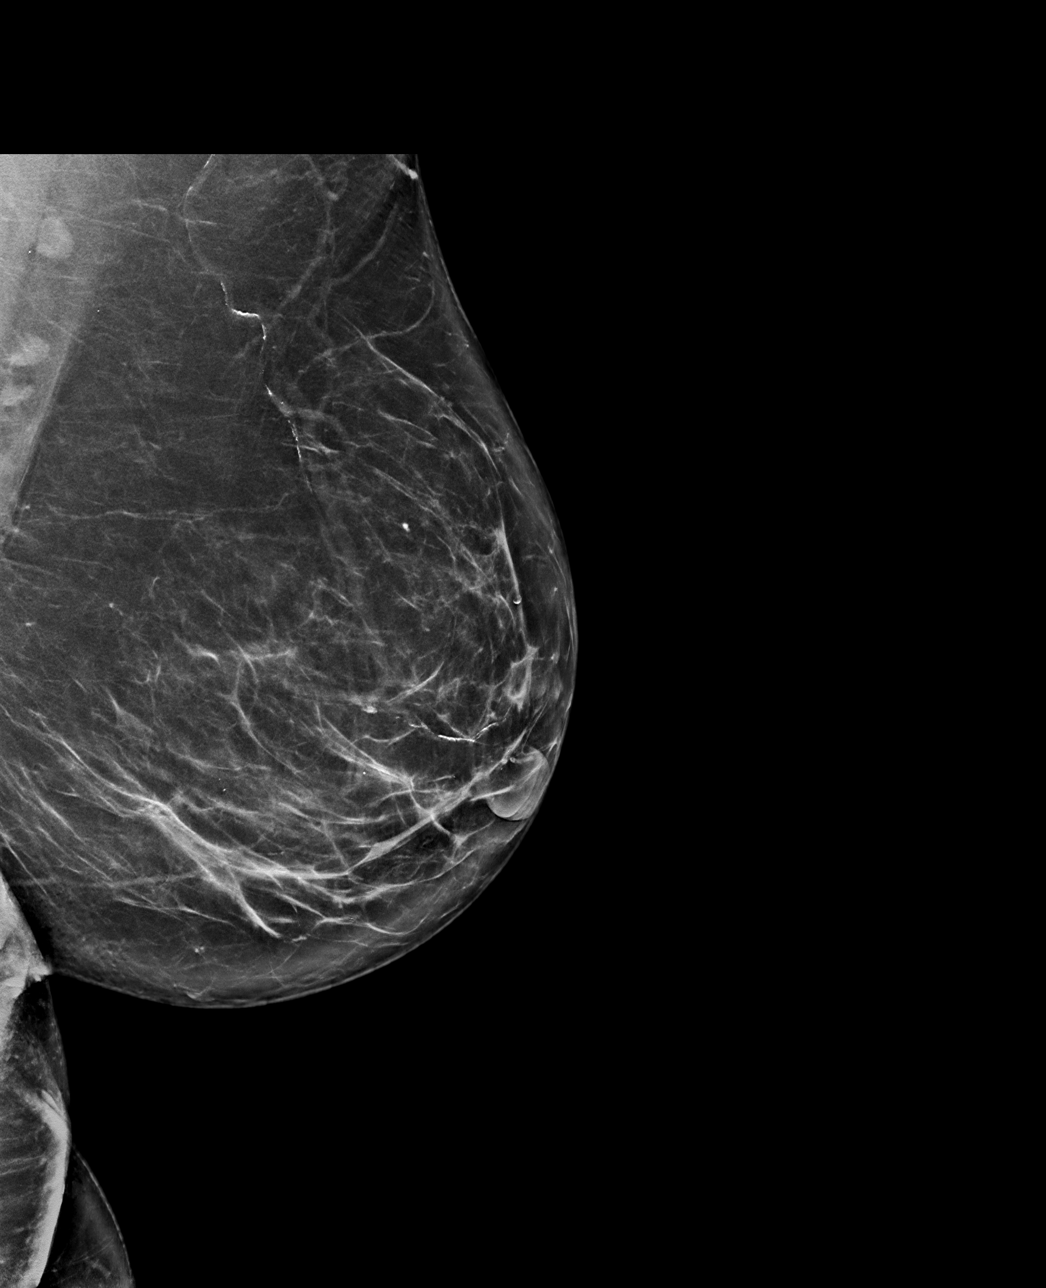

[L CC synth-2D]
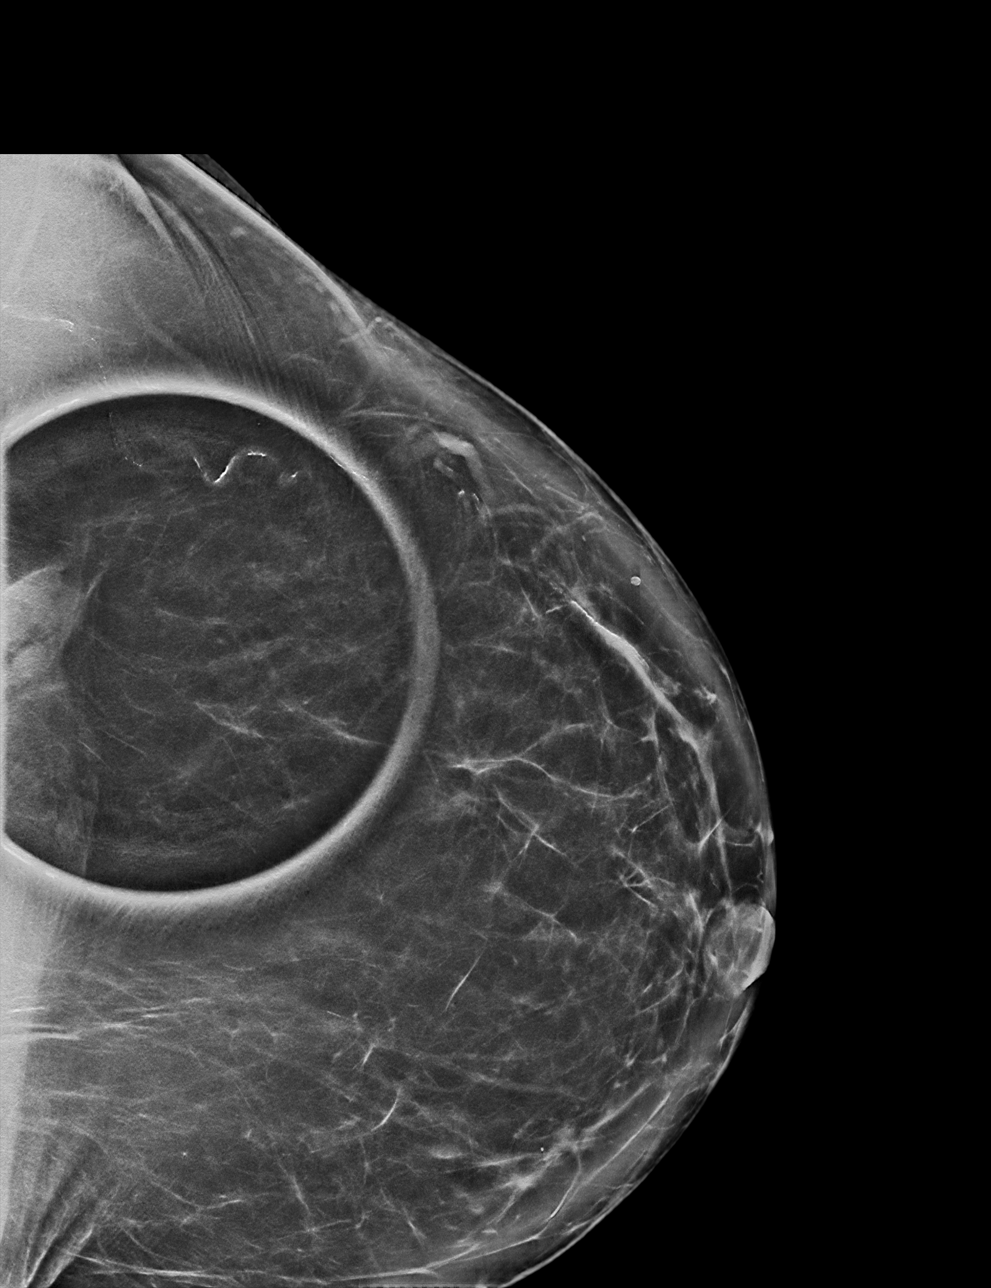

[L LM tomo · tomo slice 44/87.0]
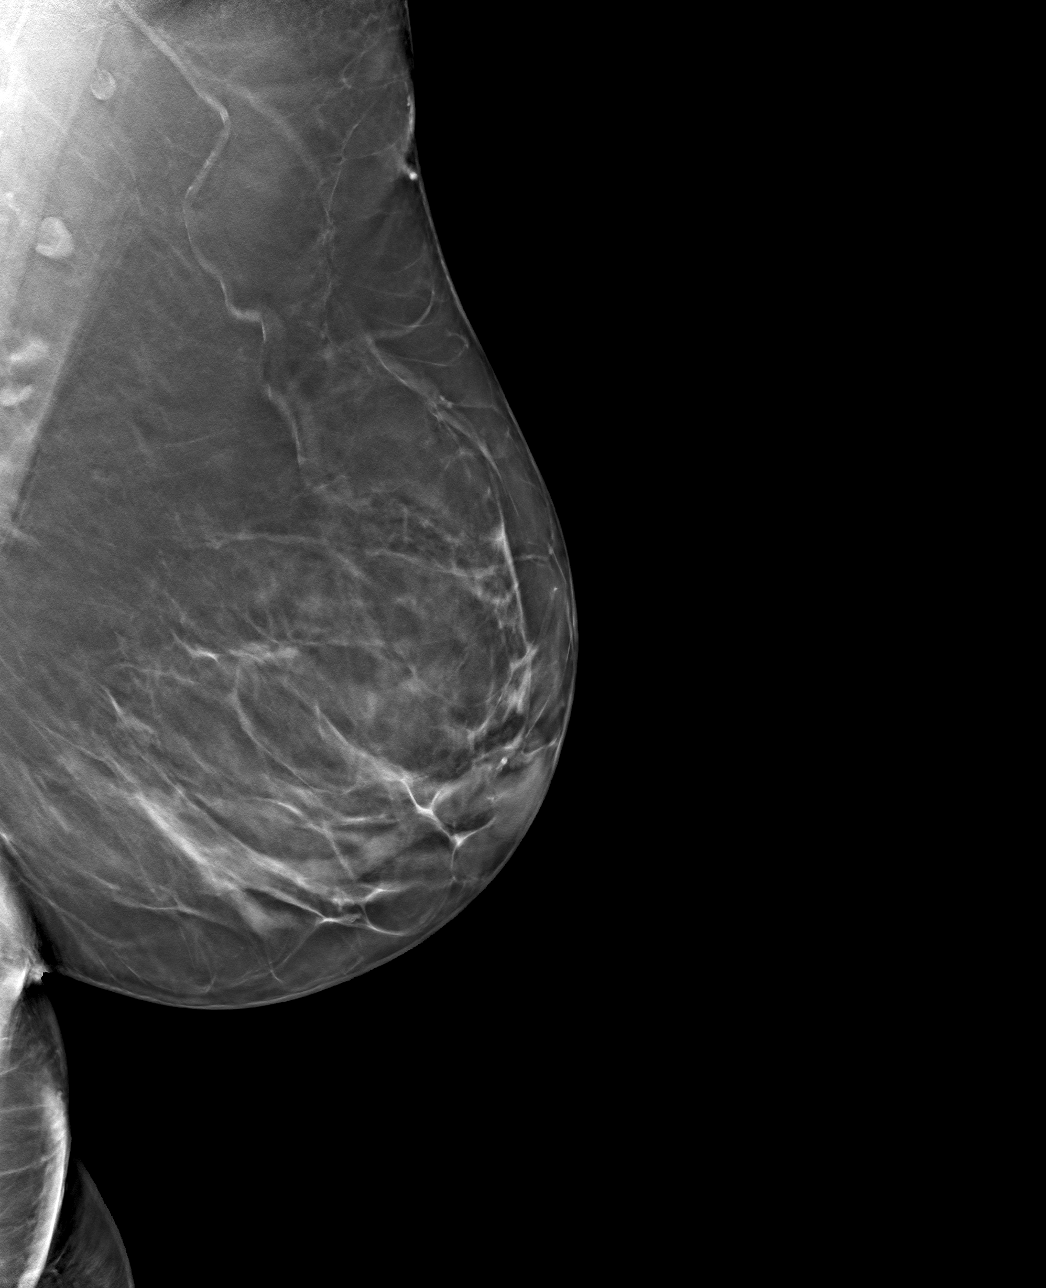

[L CC tomo · tomo slice 45/89.0]
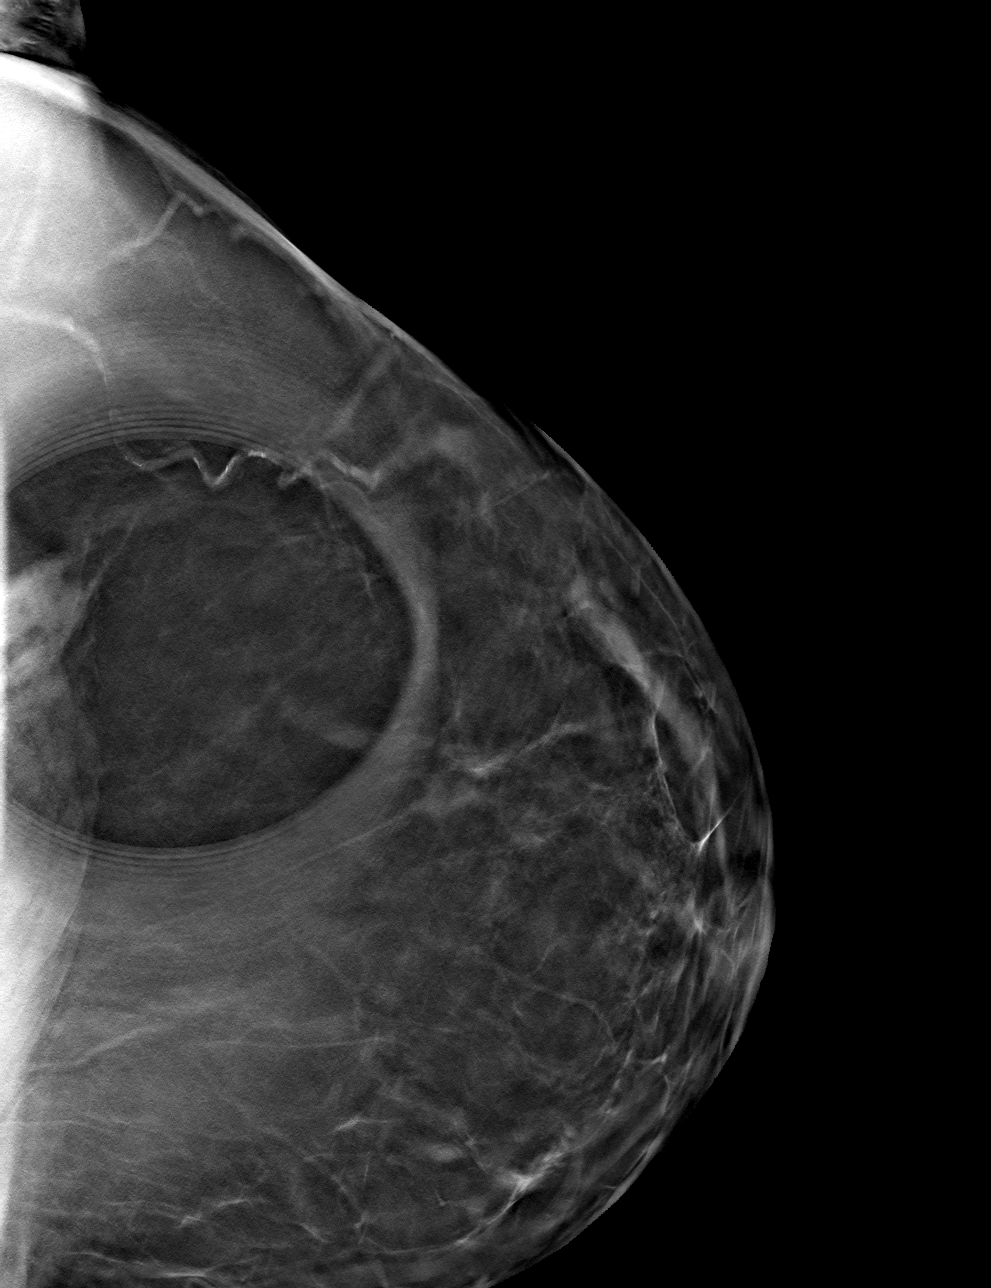

[4 of 12 positions shown; findings below may reference images not displayed]

ACR Breast Density Category b: There are scattered areas of
fibroglandular density.
FINDINGS: 2D/3D full field and spot compression views of the LEFT breast
demonstrate a persistent asymmetry in the posterior OUTER LEFT
breast identified only on the CC view. This asymmetry has the
appearance of muscle on the screening study and contains fat on some
images.

Mammographic images were processed with CAD.

Targeted ultrasound is performed, showing no solid or cystic mass,
distortion or abnormal shadowing within the LEFT breast. Thorough
ultrasound evaluation of the posterior LEFT breast was performed
demonstrating no abnormalities. No abnormal LEFT axillary lymph
nodes identified.
IMPRESSION: Posterior OUTER LEFT breast asymmetry without sonographic correlate
which may represent muscle given appearance. Options of MRI and
short-term follow-up were discussed with the patient and she desires
short-term interval follow-up.

RECOMMENDATION:
LEFT diagnostic mammogram with possible LEFT breast ultrasound in 6
months.

I have discussed the findings and recommendations with the patient.
Results were also provided in writing at the conclusion of the
visit. If applicable, a reminder letter will be sent to the patient
regarding the next appointment.

BI-RADS CATEGORY  3: Probably benign.

## 2018-06-05 ENCOUNTER — Other Ambulatory Visit: Payer: Self-pay | Admitting: Family Medicine

## 2018-12-02 ENCOUNTER — Ambulatory Visit: Payer: Medicare HMO

## 2018-12-02 ENCOUNTER — Ambulatory Visit
Admission: RE | Admit: 2018-12-02 | Discharge: 2018-12-02 | Disposition: A | Discharge status: Home / Self Care | Payer: Medicare HMO | Source: Ambulatory Visit | Attending: Family Medicine | Admitting: Family Medicine

## 2018-12-02 ENCOUNTER — Other Ambulatory Visit: Payer: Self-pay

## 2018-12-02 DIAGNOSIS — N6489 Other specified disorders of breast: Secondary | ICD-10-CM

## 2018-12-02 DIAGNOSIS — R928 Other abnormal and inconclusive findings on diagnostic imaging of breast: Secondary | ICD-10-CM

## 2018-12-02 IMAGING — MG DIGITAL DIAGNOSTIC UNILATERAL LEFT MAMMOGRAM WITH TOMO AND CAD
6 series · 6 of 18 positions shown · non-contrast
Comparison: Previous exam(s).

CLINICAL DATA: Six-month follow-up of a likely benign asymmetry in
the INNER LEFT breast at far POSTERIOR depth visualized only on the
CC images at that time and without sonographic correlate.

EXAM:
DIGITAL DIAGNOSTIC UNILATERAL LEFT MAMMOGRAM WITH CAD AND TOMO

[L CC synth-2D]
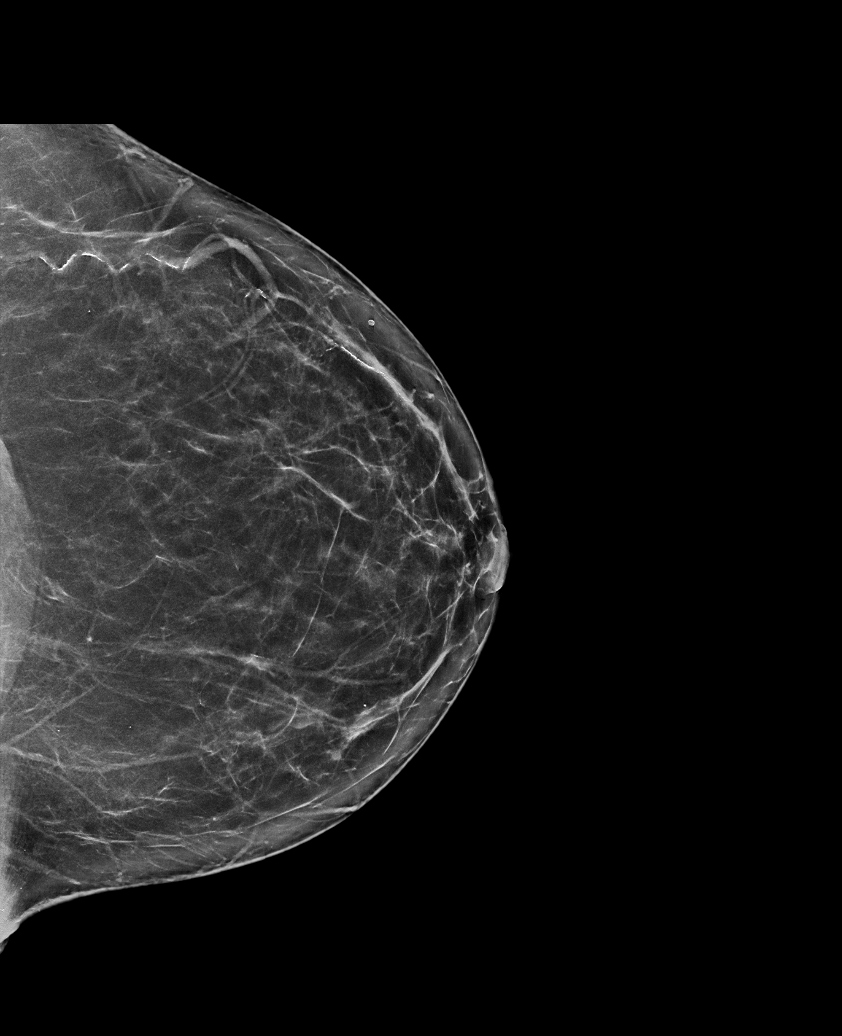

[L MLO synth-2D]
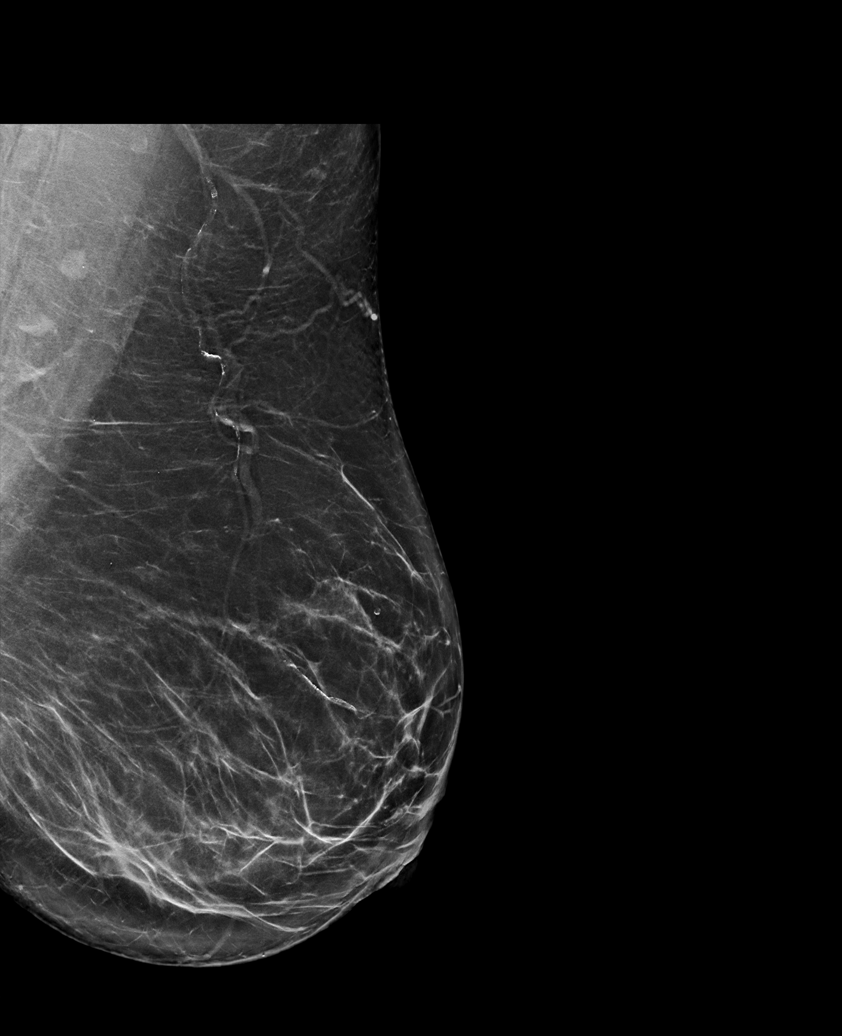

[L XCCL synth-2D]
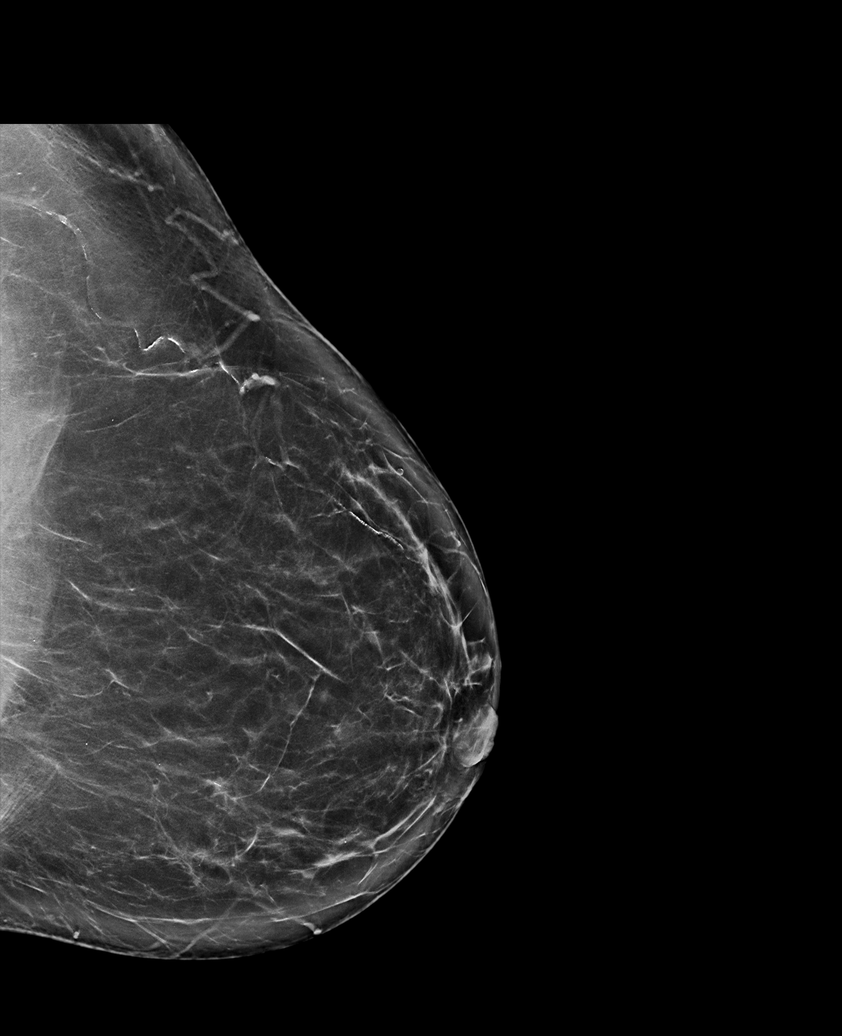

[L XCCL tomo · tomo slice 44/87.0]
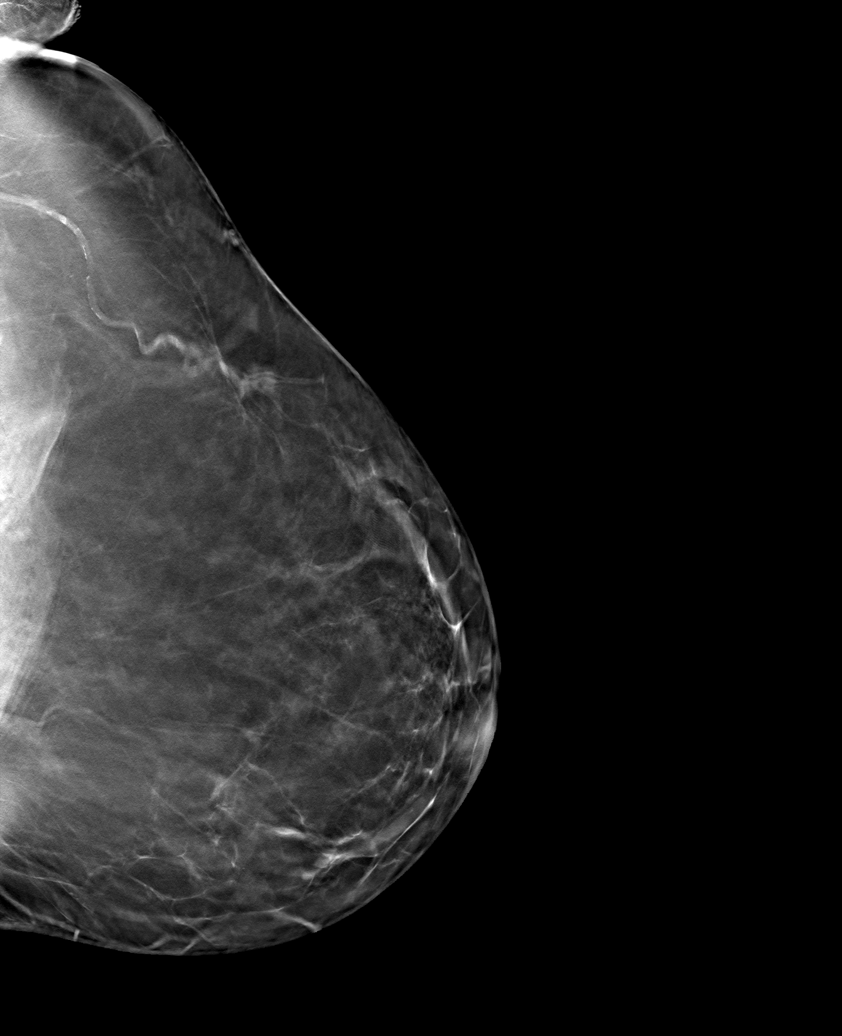

[L CC tomo · tomo slice 40/79.0]
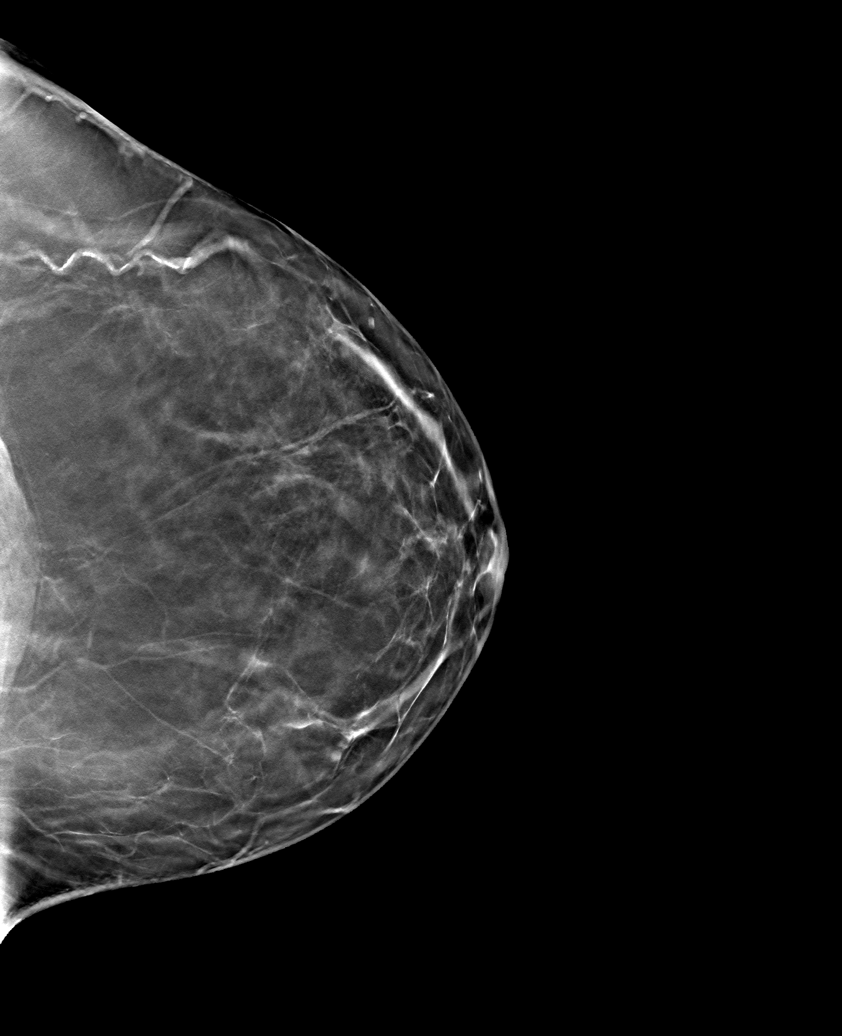

[L MLO tomo · tomo slice 44/87.0]
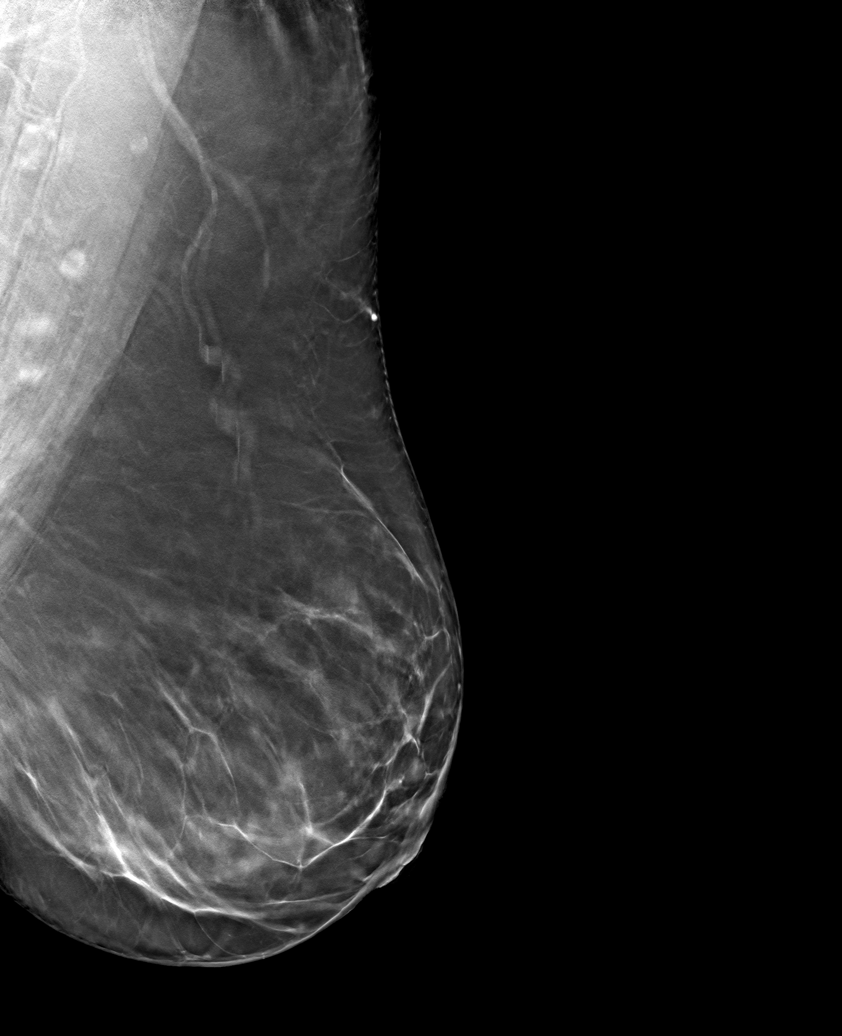

[6 of 18 positions shown; findings below may reference images not displayed]

ACR Breast Density Category b: There are scattered areas of
fibroglandular density.
FINDINGS: Tomosynthesis and synthesized full field CC, laterally exaggerated
CC and MLO views of the LEFT breast were obtained.

The asymmetry in the INNER breast at far POSTERIOR depth questioned
on mammogram in [DATE] corresponds to edge of the pectoralis
muscle, best seen on the laterally exaggerated CC views.

No findings suspicious for malignancy in the LEFT breast.

Mammographic images were processed with CAD.
IMPRESSION: 1. No mammographic evidence of malignancy involving the LEFT breast.
2. The asymmetry questioned on the mammography 6 months ago
corresponds to the edge of the pectoralis muscle.

RECOMMENDATION:
Annual BILATERAL screening mammography in 6 months.

I have discussed the findings and recommendations with the patient.
If applicable, a reminder letter will be sent to the patient
regarding the next appointment.

BI-RADS CATEGORY  2: Benign.

## 2020-03-29 ENCOUNTER — Other Ambulatory Visit: Payer: Self-pay | Admitting: Family Medicine

## 2020-03-29 DIAGNOSIS — Z1231 Encounter for screening mammogram for malignant neoplasm of breast: Secondary | ICD-10-CM

## 2020-05-09 ENCOUNTER — Ambulatory Visit
Admission: RE | Admit: 2020-05-09 | Discharge: 2020-05-09 | Disposition: A | Discharge status: Home / Self Care | Payer: Medicare HMO | Source: Ambulatory Visit | Attending: Family Medicine | Admitting: Family Medicine

## 2020-05-09 ENCOUNTER — Other Ambulatory Visit: Payer: Self-pay

## 2020-05-09 DIAGNOSIS — Z1231 Encounter for screening mammogram for malignant neoplasm of breast: Secondary | ICD-10-CM

## 2020-05-09 IMAGING — MG MM DIGITAL SCREENING BILAT W/ TOMO AND CAD
8 series · 8 of 24 positions shown · non-contrast
Comparison: Previous exam(s).

CLINICAL DATA: Screening.

EXAM:
DIGITAL SCREENING BILATERAL MAMMOGRAM WITH TOMO AND CAD

[L MLO synth-2D]
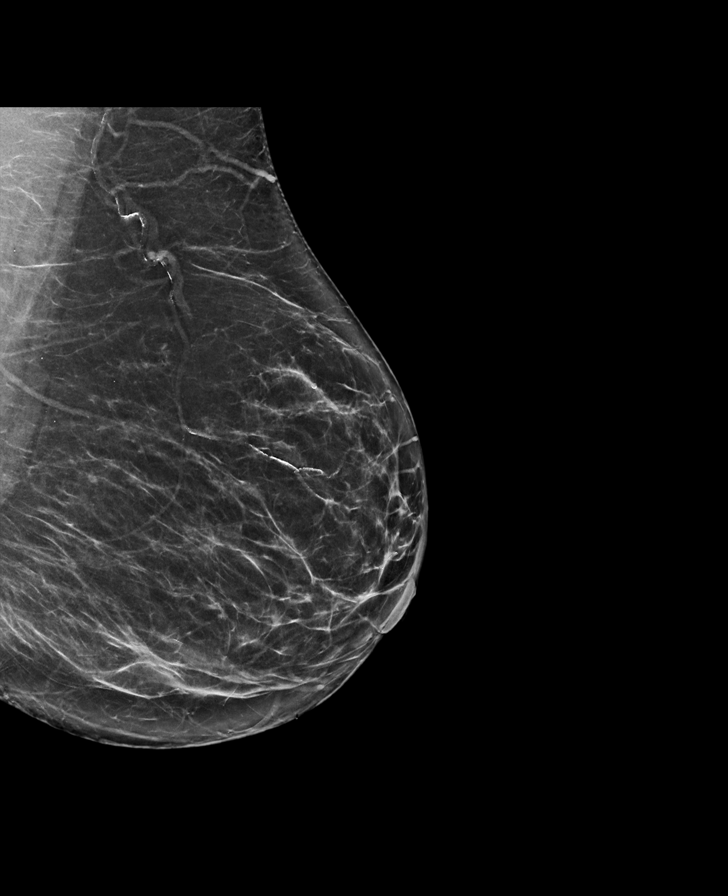

[R MLO synth-2D]
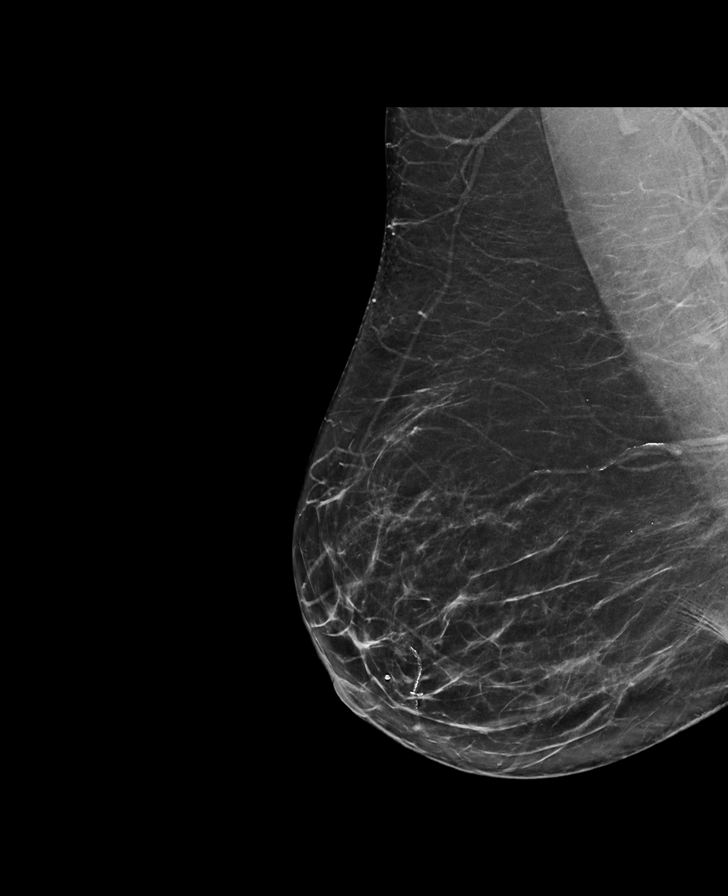

[R CC synth-2D]
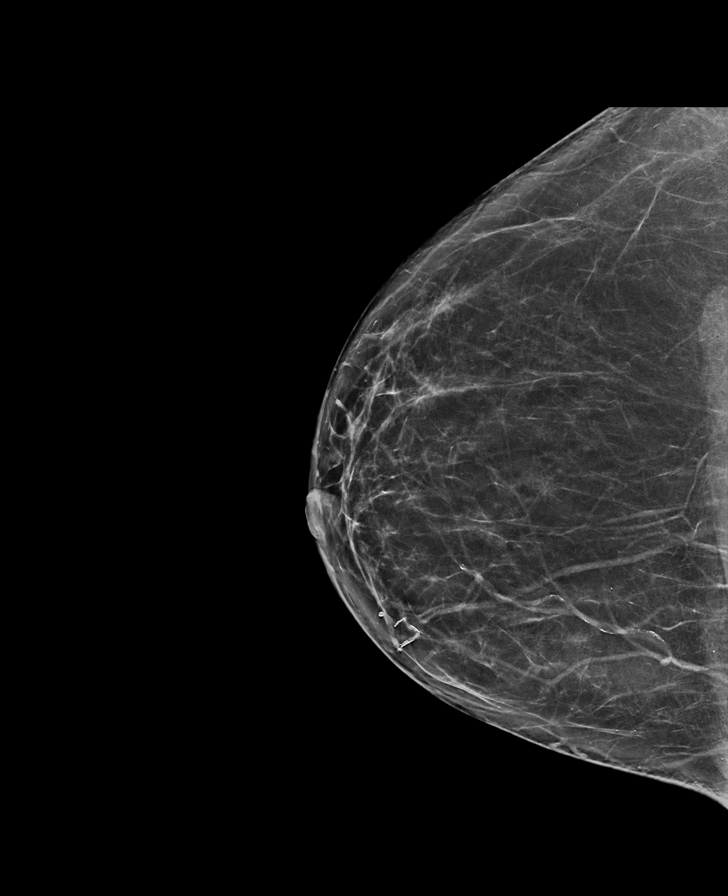

[L CC synth-2D]
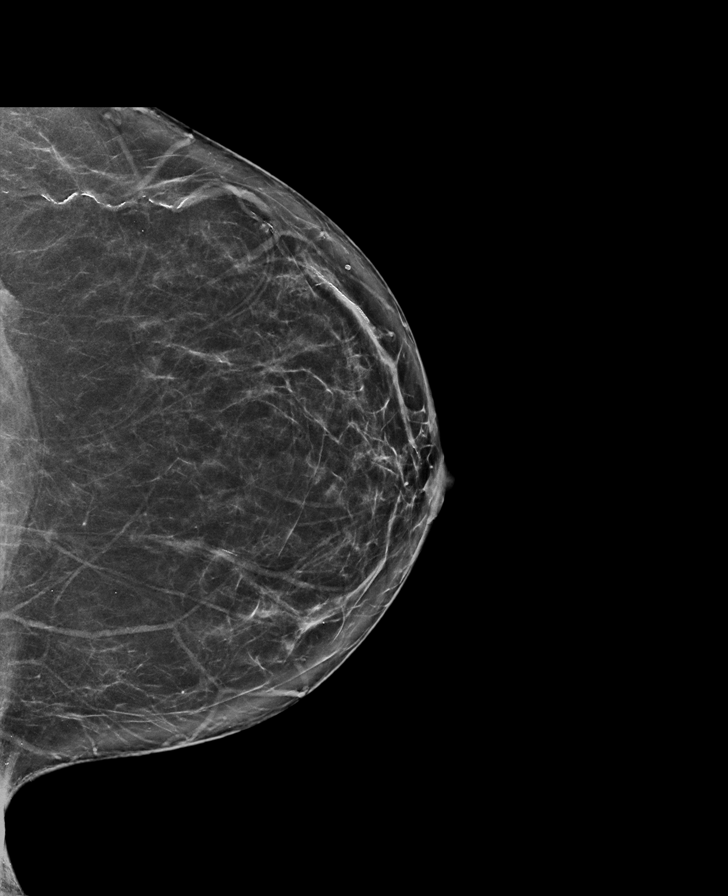

[R MLO tomo · tomo slice 39/78.0]
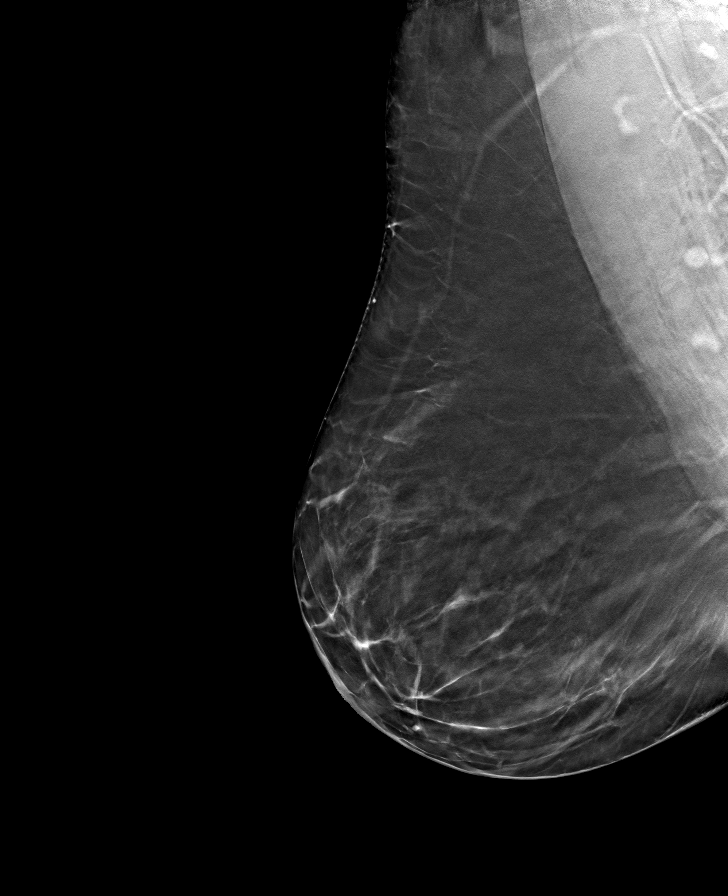

[L CC tomo · tomo slice 37/73.0]
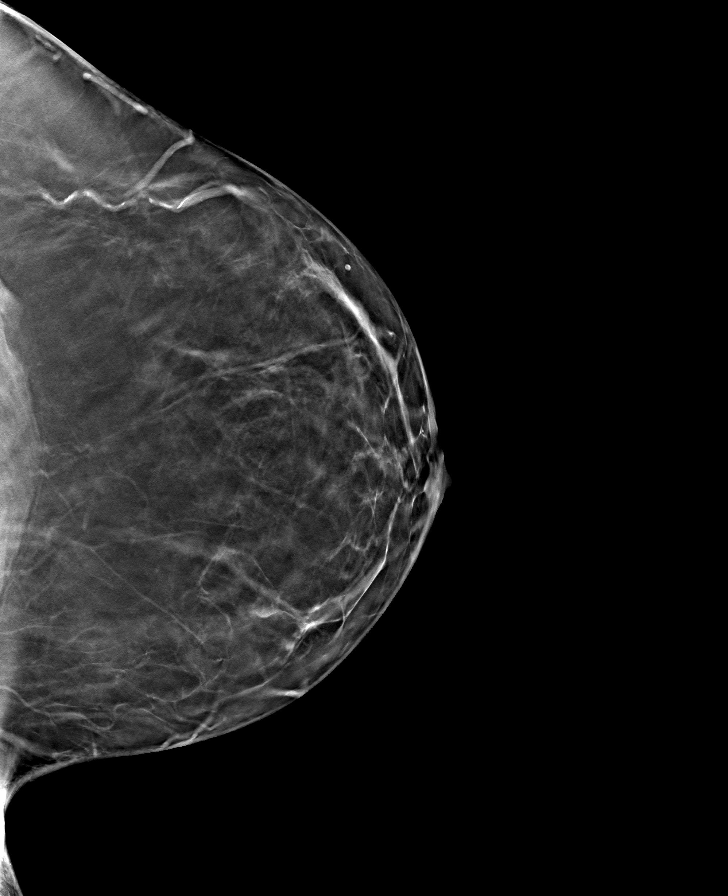

[R CC tomo · tomo slice 37/73.0]
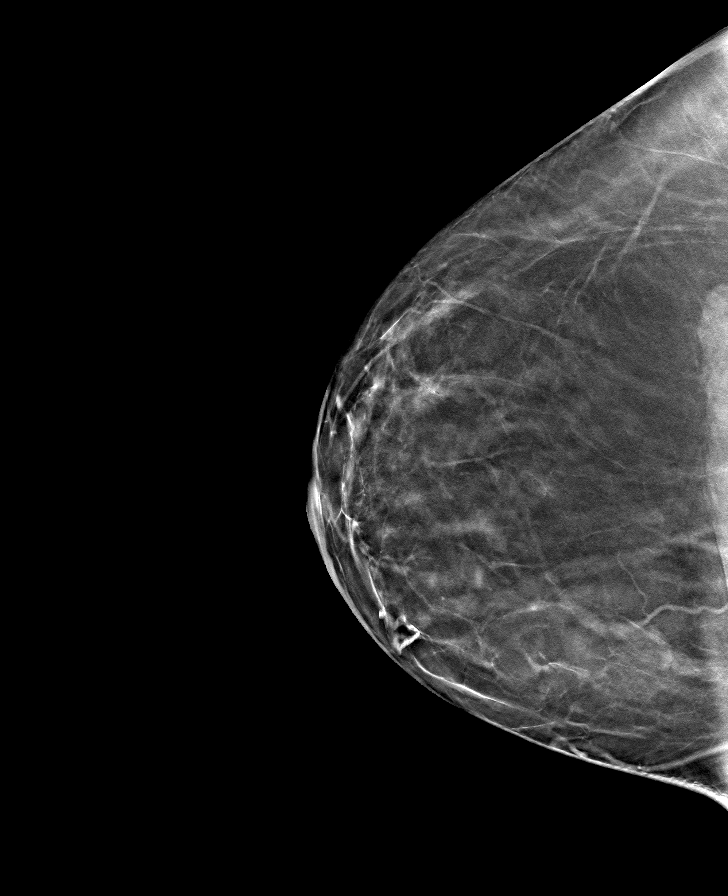

[L MLO tomo · tomo slice 38/75.0]
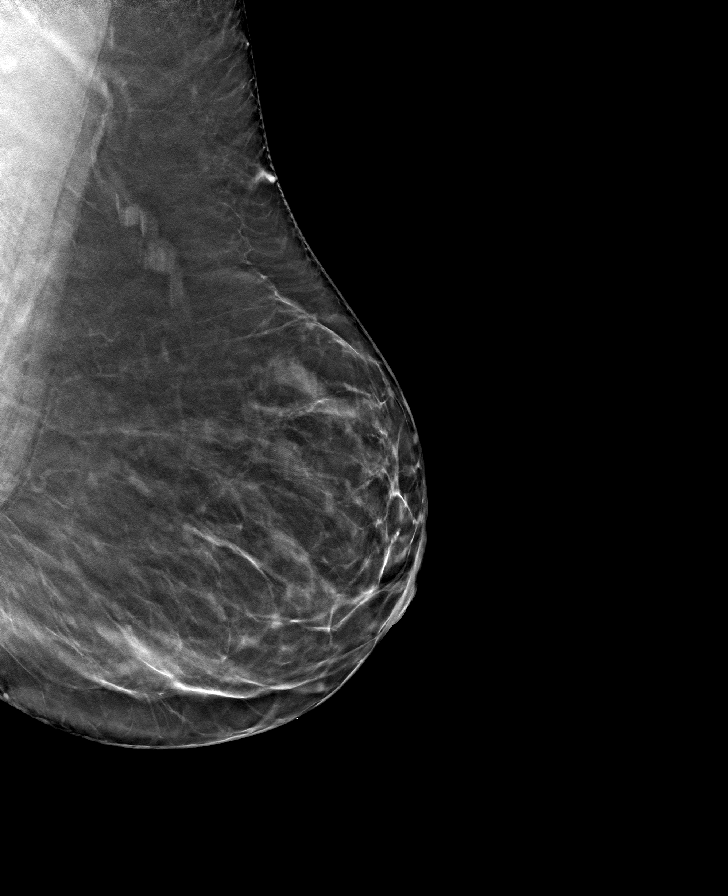

[8 of 24 positions shown; findings below may reference images not displayed]

ACR Breast Density Category b: There are scattered areas of
fibroglandular density.
FINDINGS: There are no findings suspicious for malignancy. The images were
evaluated with computer-aided detection.
IMPRESSION: No mammographic evidence of malignancy. A result letter of this
screening mammogram will be mailed directly to the patient.

RECOMMENDATION:
Screening mammogram in one year. (Code:[MT])

BI-RADS CATEGORY  1: Negative.

## 2020-07-06 ENCOUNTER — Telehealth: Payer: Self-pay | Admitting: Internal Medicine

## 2020-07-06 NOTE — Telephone Encounter (Signed)
Call made to patient, spoke with patient husband, confirmed patient DOB. Husband states the patient used to live in [place] and she was getting her supplies from [place]. Patient needs a mask replacement. Made patient aware since she has not been seen since 2016 she will need a face to face appt. Appt made. Reminder printed and placed upfront to be mailed.   Nothing further needed at this time.

## 2020-08-03 ENCOUNTER — Encounter: Payer: Self-pay | Admitting: Pulmonary Disease

## 2020-08-03 ENCOUNTER — Ambulatory Visit: Payer: Medicare HMO | Admitting: Pulmonary Disease

## 2020-08-03 ENCOUNTER — Other Ambulatory Visit: Payer: Self-pay

## 2020-08-03 VITALS — BP 132/78 | HR 66 | Temp 98.5°F | Ht 63.0 in | Wt 176.0 lb

## 2020-08-03 DIAGNOSIS — G4733 Obstructive sleep apnea (adult) (pediatric): Secondary | ICD-10-CM

## 2020-08-03 DIAGNOSIS — K219 Gastro-esophageal reflux disease without esophagitis: Secondary | ICD-10-CM | POA: Diagnosis not present

## 2020-08-03 NOTE — Assessment & Plan Note (Signed)
OSA appears to be well controlled on CPAP of 12 cm.  Symptoms are controlled and she feels better rested.  She does have a leak and needs a new mask and CPAP supplies. Will establish care with a new DME-[facility] and send prescription for CPAP supplies. Her weight is unchanged and I do not feel the need for a repeat sleep study here. For the mask leak, I advised her to try a cloth liner  Weight loss encouraged, compliance with goal of at least 4-6 hrs every night is the expectation. Advised against medications with sedative side effects Cautioned against driving when sleepy - understanding that sleepiness will vary on a day to day basis

## 2020-08-03 NOTE — Patient Instructions (Addendum)
RX for CPAP supplies to [facility] @ [place] CPAP is set at 12 cm & working well You have Resmed airfit F 20 small full face mask  You can try REM-zzz cloth liner - small full face

## 2020-08-03 NOTE — Assessment & Plan Note (Signed)
Improved off aspirin. Continues on omeprazole

## 2020-08-03 NOTE — Progress Notes (Signed)
Subjective:    Patient ID: Bridget Phelps, female    DOB: 02-Dec-1950, 70 y.o.   MRN: [id number]  HPI  70 year old woman presents to re-establish care for OSA She was diagnosed with a home sleep test 04/2013 which showed moderate OSA.  She presented at that time with daytime somnolence, loud snoring and witnessed apneas by her husband.  She was started on a CPAP with a small full facemask and this has really worked wonders for her.  She feels better rested when she wakes up in the morning.  She is unable to sleep without her machine she has been very compliant for the past few years but has been lost to follow-up since 2016.  Her DME was [facility] in [place] and she has now moved to [place]. She brings in her CPAP machine and I was able to obtain her download.  CPAP is set at 12 cm and download shows good compliance more than 6 hours every night, good control of events, mild leak which has been corrected in the last 2 weeks.  She reports that she was tightening the straps and she would like to obtain new supplies and established with another DME closer to where she lives. Epworth sleepiness score is 8. Bedtime is early at 9 PM, sleep latency is minimal, she sleeps on her left side with a surround pillow, reports 1-2 nocturnal awakenings and is out of bed by 5:30 AM feeling rested without dryness of mouth or headaches. No problems with mask or pressure There is no history suggestive of cataplexy, sleep paralysis or parasomnias   Significant tests/ events reviewed  HST 04/2013:  AHI 14/hr.   Past Medical History:  Diagnosis Date  . GERD (gastroesophageal reflux disease)   . Hypercholesterolemia    no Rx meds  . Hypertension    controlled on lisinopril/hctz  . Sleep apnea    does have cpap  . Tubular adenoma of colon 07/2011   possibly  Allergies  Allergen Reactions  . Sulfa Antibiotics Rash    Social History   Socioeconomic History  . Marital status: Married     Spouse name: Not on file  . Number of children: 4  . Years of education: Not on file  . Highest education level: Not on file  Occupational History  . Occupation: retired  Tobacco Use  . Smoking status: Never Smoker  . Smokeless tobacco: Never Used  Substance and Sexual Activity  . Alcohol use: Yes    Alcohol/week: 7.0 standard drinks    Types: 7 Glasses of wine per week  . Drug use: No  . Sexual activity: Not on file  Other Topics Concern  . Not on file  Social History Narrative   Denies caffeine use.   Social Determinants of Health   Financial Resource Strain: Not on file  Food Insecurity: Not on file  Transportation Needs: Not on file  Physical Activity: Not on file  Stress: Not on file  Social Connections: Not on file  Intimate Partner Violence: Not on file     Family History  Problem Relation Age of Onset  . Colon cancer Maternal Aunt 19  . Stroke Mother   . Diabetes Mother   . CAD Father   . Stroke Father   . Lung cancer Father   . Diabetes Sister   . Heart attack Sister   . Diabetes Maternal Grandmother   . Colon cancer Maternal Grandmother        greatgrand  .  Breast cancer Maternal Grandmother   . Stroke Maternal Grandfather   . Diabetes Paternal Grandmother   . Esophageal cancer Neg Hx   . Stomach cancer Neg Hx   . Rectal cancer Neg Hx       Review of Systems  Acid heartburn is much improved after stopping aspirin  Constitutional: negative for anorexia, fevers and sweats  Eyes: negative for irritation, redness and visual disturbance  Ears, nose, mouth, throat, and face: negative for earaches, epistaxis, nasal congestion and sore throat  Respiratory: negative for cough, dyspnea on exertion, sputum and wheezing  Cardiovascular: negative for chest pain, dyspnea, lower extremity edema, orthopnea, palpitations and syncope  Gastrointestinal: negative for abdominal pain, constipation, diarrhea, melena, nausea and vomiting  Genitourinary:negative for  dysuria, frequency and hematuria  Hematologic/lymphatic: negative for bleeding, easy bruising and lymphadenopathy  Musculoskeletal:negative for arthralgias, muscle weakness and stiff joints  Neurological: negative for coordination problems, gait problems, headaches and weakness  Endocrine: negative for diabetic symptoms including polydipsia, polyuria and weight loss     Objective:   Physical Exam  Gen. Pleasant, well-nourished, in no distress, normal affect ENT - no pallor,icterus, no post nasal drip Neck: No JVD, no thyromegaly, no carotid bruits Lungs: no use of accessory muscles, no dullness to percussion, clear without rales or rhonchi  Cardiovascular: Rhythm regular, heart sounds  normal, no murmurs or gallops, no peripheral edema Abdomen: soft and non-tender, no hepatosplenomegaly, BS normal. Musculoskeletal: No deformities, no cyanosis or clubbing Neuro:  alert, non focal       Assessment & Plan:

## 2020-08-11 ENCOUNTER — Telehealth: Payer: Self-pay | Admitting: Pulmonary Disease

## 2020-08-11 DIAGNOSIS — G4733 Obstructive sleep apnea (adult) (pediatric): Secondary | ICD-10-CM

## 2020-08-11 NOTE — Telephone Encounter (Signed)
Spoke with patient. She stated that she called [facility] in [place] to follow up on her cpap supplies. She was told that they did not receive the order. I reviewed her chart and saw where the order had been placed incorrectly. I advised her that I would go ahead and place the order correctly. She verbalized understanding.   Nothing further needed at time of call.

## 2020-08-15 ENCOUNTER — Telehealth: Payer: Self-pay | Admitting: Pulmonary Disease

## 2020-08-15 NOTE — Telephone Encounter (Signed)
Bridget Phelps and was told LOV note from Dr. Elsworth Soho was needed so Patient could get cpap supplies. LOV note faxed to [facility]. I called and spoke with Patient. Patient is ware all that was needed was LOV note and that it had been faxed. Understanding stated. Nothing further at this time.

## 2020-11-25 ENCOUNTER — Encounter: Payer: Self-pay | Admitting: Gastroenterology

## 2020-12-28 ENCOUNTER — Ambulatory Visit: Payer: Medicare HMO | Admitting: Gastroenterology

## 2020-12-29 ENCOUNTER — Ambulatory Visit: Payer: Medicare HMO | Admitting: Gastroenterology

## 2021-01-03 ENCOUNTER — Ambulatory Visit: Payer: Medicare HMO | Admitting: Gastroenterology

## 2021-03-30 ENCOUNTER — Other Ambulatory Visit: Payer: Self-pay | Admitting: Family Medicine

## 2021-03-30 DIAGNOSIS — Z1231 Encounter for screening mammogram for malignant neoplasm of breast: Secondary | ICD-10-CM

## 2021-04-04 ENCOUNTER — Other Ambulatory Visit (HOSPITAL_COMMUNITY): Payer: Self-pay | Admitting: Family Medicine

## 2021-04-04 ENCOUNTER — Other Ambulatory Visit: Payer: Self-pay | Admitting: Family Medicine

## 2021-04-04 DIAGNOSIS — M542 Cervicalgia: Secondary | ICD-10-CM

## 2021-04-04 DIAGNOSIS — R519 Headache, unspecified: Secondary | ICD-10-CM

## 2021-04-04 DIAGNOSIS — G4484 Primary exertional headache: Secondary | ICD-10-CM

## 2021-04-04 DIAGNOSIS — R42 Dizziness and giddiness: Secondary | ICD-10-CM

## 2021-04-21 ENCOUNTER — Ambulatory Visit (HOSPITAL_COMMUNITY)
Admission: RE | Admit: 2021-04-21 | Discharge: 2021-04-21 | Disposition: A | Discharge status: Home / Self Care | Payer: Medicare HMO | Source: Ambulatory Visit | Attending: Family Medicine | Admitting: Family Medicine

## 2021-04-21 ENCOUNTER — Encounter (HOSPITAL_COMMUNITY): Payer: Self-pay

## 2021-04-21 ENCOUNTER — Ambulatory Visit (HOSPITAL_COMMUNITY): Payer: Medicare HMO

## 2021-04-21 ENCOUNTER — Ambulatory Visit (HOSPITAL_COMMUNITY): Admission: RE | Admit: 2021-04-21 | Payer: Medicare HMO | Source: Ambulatory Visit

## 2021-04-21 ENCOUNTER — Ambulatory Visit: Payer: Medicare HMO | Admitting: Cardiology

## 2021-04-21 ENCOUNTER — Other Ambulatory Visit: Payer: Self-pay

## 2021-04-21 DIAGNOSIS — R519 Headache, unspecified: Secondary | ICD-10-CM | POA: Diagnosis present

## 2021-04-21 DIAGNOSIS — I6782 Cerebral ischemia: Secondary | ICD-10-CM | POA: Diagnosis not present

## 2021-04-21 DIAGNOSIS — M542 Cervicalgia: Secondary | ICD-10-CM | POA: Insufficient documentation

## 2021-04-21 DIAGNOSIS — I6501 Occlusion and stenosis of right vertebral artery: Secondary | ICD-10-CM | POA: Diagnosis not present

## 2021-04-21 DIAGNOSIS — G4484 Primary exertional headache: Secondary | ICD-10-CM | POA: Diagnosis not present

## 2021-04-21 DIAGNOSIS — R42 Dizziness and giddiness: Secondary | ICD-10-CM | POA: Diagnosis present

## 2021-04-21 IMAGING — MR MR HEAD WO/W CM
20 of 22 series · 41 of 48 positions shown · IV contrast (gadavist)
Comparison: MRI brain and MRA head [DATE].
COMPARISON: MRI brain and MRA head [DATE].

Addendum:
CLINICAL DATA: Provided history: Worst headache of life. Neck pain.
Dizziness. Exertional headache, primary. Additional history provided
by scanning technologist: Patient reports episode of headache/neck
pain and dizziness 3-4 weeks ago.

EXAM:
MRI HEAD WITHOUT AND WITH CONTRAST
TECHNIQUE: Multiplanar, multiecho pulse sequences of the brain and surrounding
structures were obtained without and with intravenous contrast.
CONTRAST:  8mL GADAVIST GADOBUTROL 1 MMOL/ML IV SOLN

[Series 5: DWI · axial · 3.0mm · 1.36mm/px · z∈[-58,+83]mm · 2 of 96 slices shown (1 of 2)]
[im 1/96]
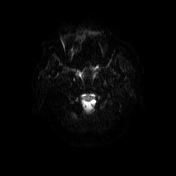
[im 96/96]
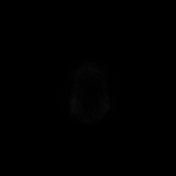

[Series 6: DWI · axial · 3.0mm · 1.36mm/px · 1 of 48 slices shown (2 of 2)]
[im 1/48]
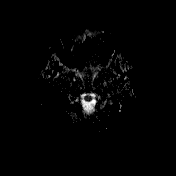

[Series 7: T1 · sagittal · 5.0mm · 0.75mm/px · 1 of 24 slices shown (1 of 2)]
[im 1/24]
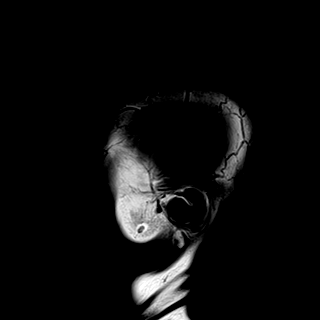

[Series 8: T2 · axial · 5.0mm · 0.62mm/px · 1 of 26 slices shown (1 of 2)]
[im 1/26]
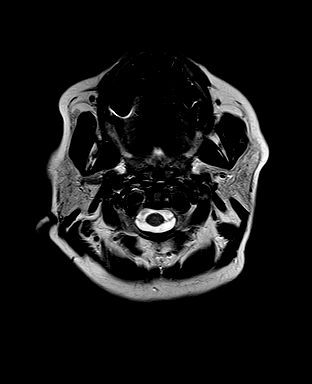

[Series 9: swi_images · axial · 3.0mm · 0.75mm/px · 1 of 56 slices shown]
[im 1/56]
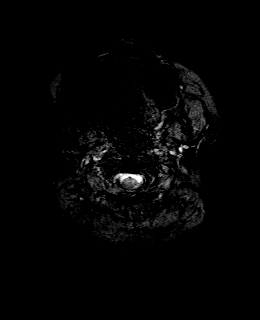

[Series 11: FLAIR · axial · 3.0mm · 0.75mm/px · 1 of 52 slices shown]
[im 1/52]
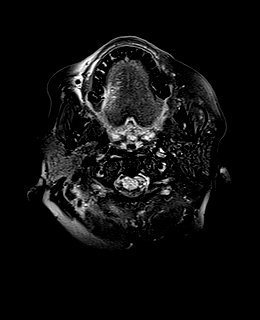

[Series 12: T1 · axial · 1.0mm · 0.94mm/px · z∈[-74,+85]mm · 5 of 160 slices shown (2 of 2)]
[im 1/160]
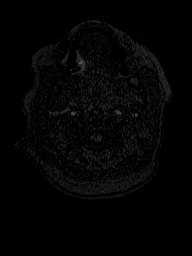
[im 40/160]
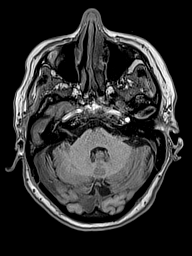
[im 80/160]
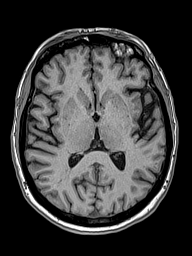
[im 120/160]
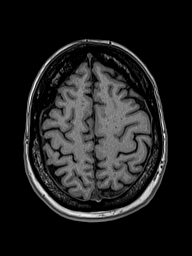
[im 160/160]
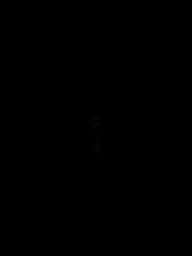

[Series 21: cor dwi_tracew · coronal · 5.0mm · 1.53mm/px · 2 of 56 slices shown]
[im 1/56]
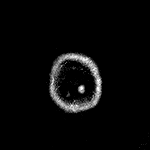
[im 56/56]
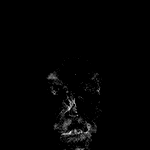

[Series 22: cor dwi_adc · coronal · 5.0mm · 1.53mm/px · 1 of 27 slices shown]
[im 1/27]
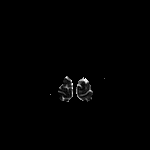

[Series 23: T2 · coronal · 5.0mm · 0.57mm/px · 1 of 32 slices shown (2 of 2)]
[im 1/32]
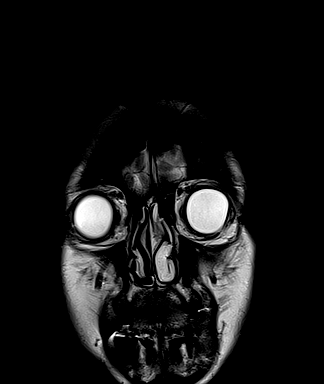

[Series 26: tof_2d_tra · axial · 3.5mm · 0.43mm/px · z∈[-218,-73]mm · 2 of 60 slices shown]
[im 1/60]
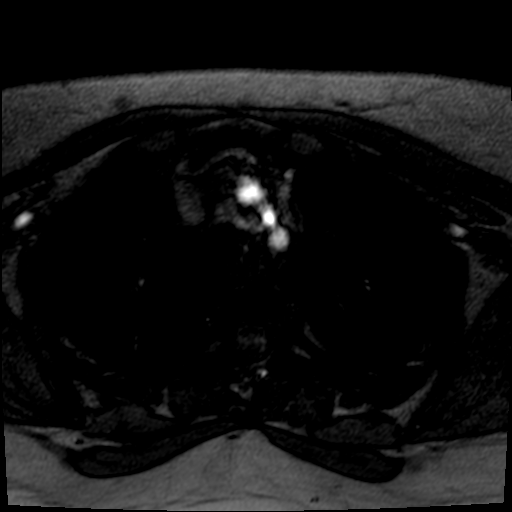
[im 60/60]
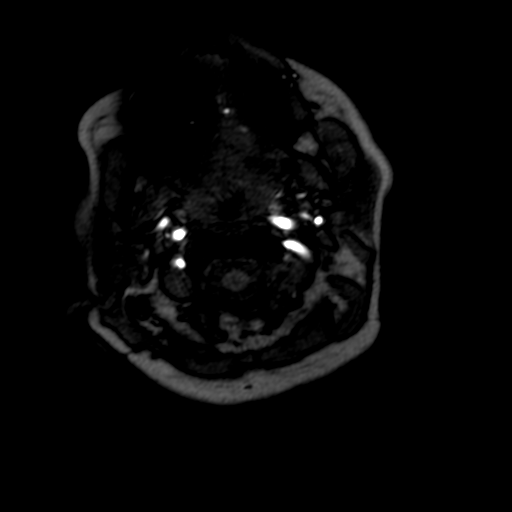

[Series 29: tof_2d_tra_mip_tra · axial · 148.0mm · 0.43mm/px · 1 of 1 slices shown]
[im 1/1]
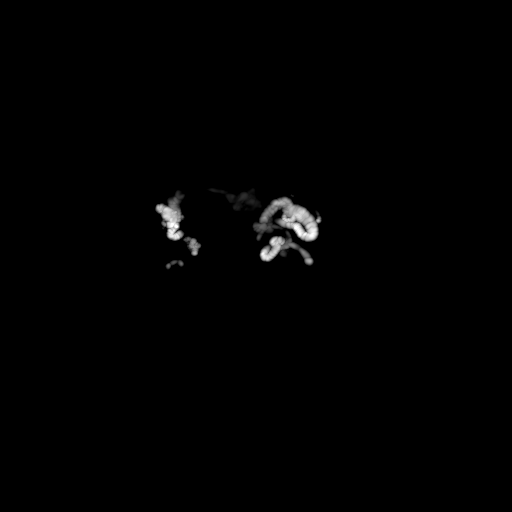

[Series 30: (id)_cor_pre · coronal · 0.8mm · 0.78mm/px · 3 of 96 slices shown]
[im 1/96]
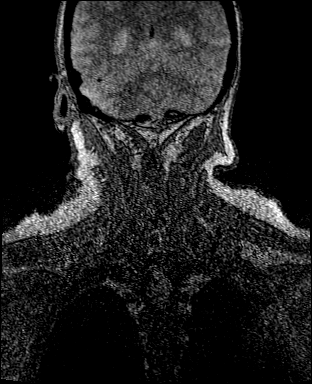
[im 48/96]
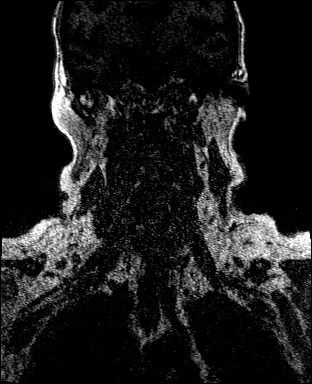
[im 96/96]
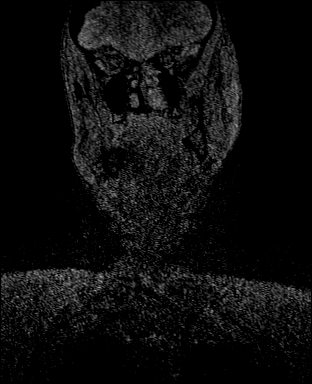

[Series 32: (id)_cor_post · coronal · 0.8mm · 0.78mm/px · 3 of 96 slices shown]
[im 1/96]
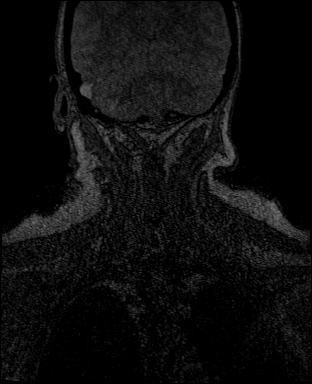
[im 48/96]
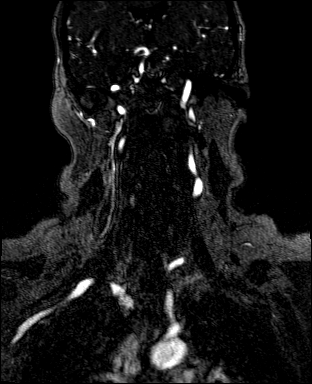
[im 96/96]
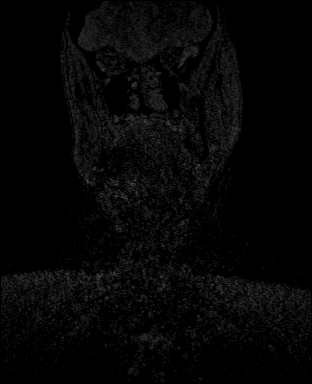

[Series 33: (id)_cor_post_sub · coronal · 0.8mm · 0.78mm/px · 3 of 89 slices shown]
[im 1/89]
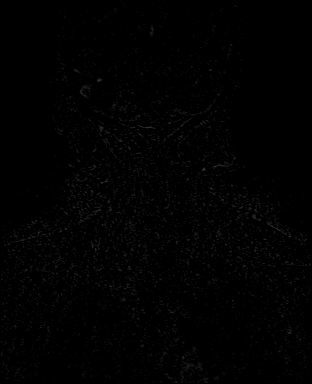
[im 45/89]
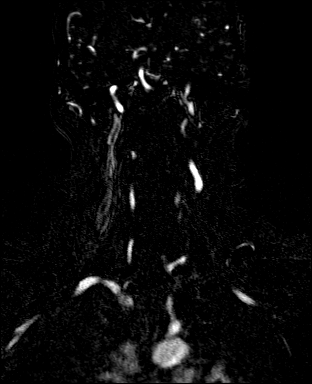
[im 89/89]
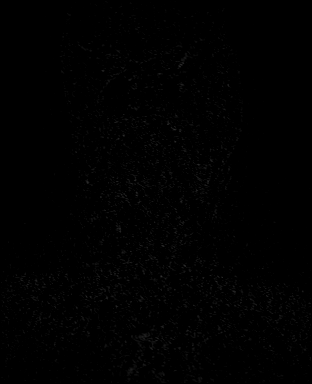

[Series 35: (id)_cor_post_venous · coronal · 0.8mm · 0.78mm/px · 3 of 96 slices shown]
[im 1/96]
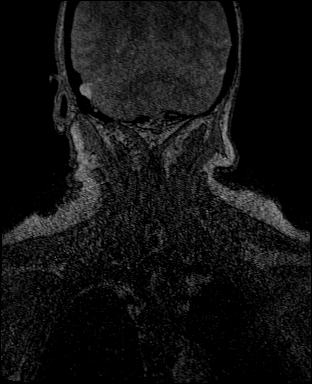
[im 48/96]
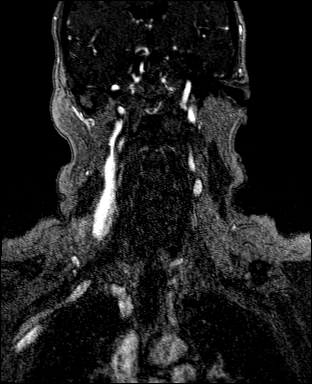
[im 96/96]
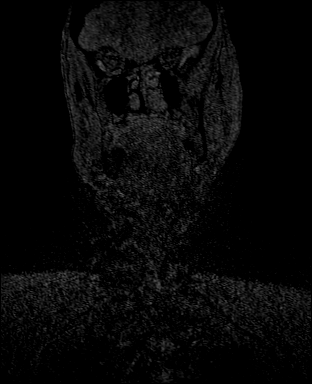

[Series 36: (id)_cor_post_venous_sub · coronal · 0.8mm · 0.78mm/px · 3 of 90 slices shown]
[im 1/90]
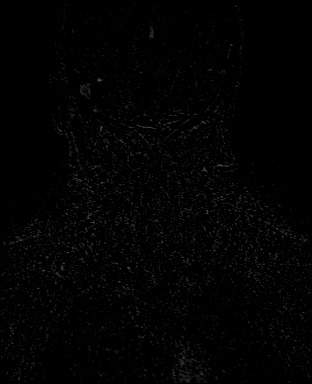
[im 45/90]
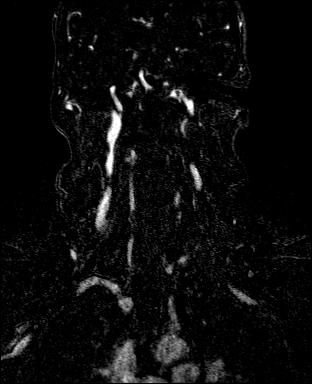
[im 90/90]
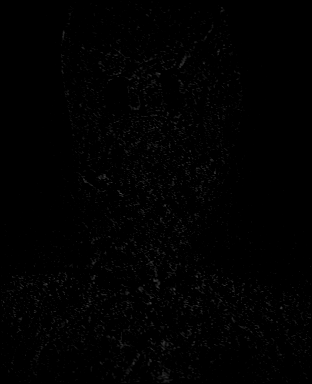

[Series 42: T1 post-contrast · axial · 1.0mm · 0.94mm/px · z∈[-57,+85]mm · 5 of 144 slices shown (1 of 3)]
[im 1/144]
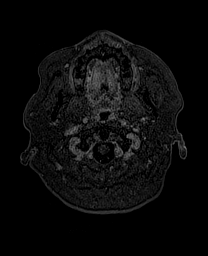
[im 36/144]
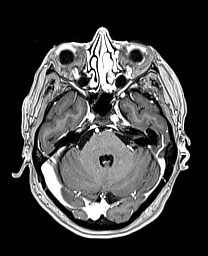
[im 72/144]
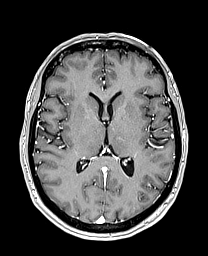
[im 108/144]
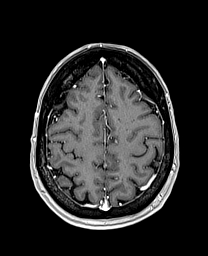
[im 144/144]
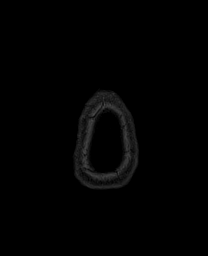

[Series 43: T1 post-contrast · coronal · 5.0mm · 0.43mm/px · 1 of 28 slices shown (2 of 3)]
[im 1/28]
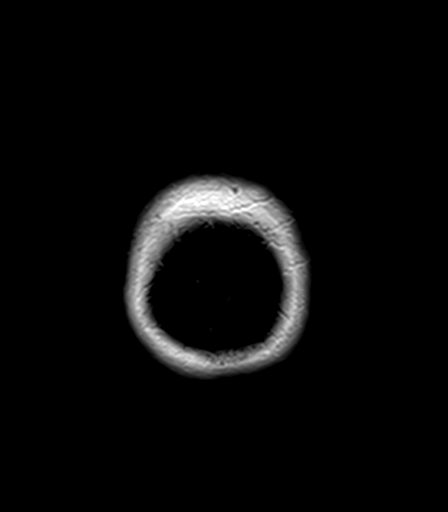

[Series 44: T1 post-contrast · sagittal · 5.0mm · 0.75mm/px · 1 of 24 slices shown (3 of 3)]
[im 1/24]
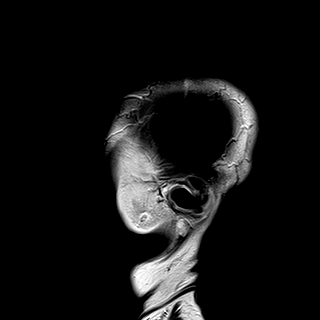

[41 of 48 positions shown; findings below may reference images not displayed]

FINDINGS: Brain:

Cerebral volume is normal.

Mild multifocal T2 FLAIR hyperintense signal abnormality within the
cerebral white matter, nonspecific but compatible with chronic small
vessel ischemic disease.

There is no acute infarct.

No evidence of an intracranial mass.

No chronic intracranial blood products.

No extra-axial fluid collection.

No midline shift.

Vascular: Maintained flow voids within the proximal large arterial
vessels.

Skull and upper cervical spine: No focal suspicious marrow lesion.
Incompletely assessed cervical spondylosis.

Sinuses/Orbits: Visualized orbits show no acute finding. Small
mucous retention cyst within the right maxillary sinus. Minimal
mucosal thickening within the bilateral ethmoid sinuses.

Other: Trace fluid within the left mastoid air cells.
IMPRESSION: No evidence of acute intracranial abnormality.

Mild chronic small vessel ischemic changes within the cerebral white
matter, only slightly progressed from the brain MRI of [DATE].

Mild paranasal sinus disease, as described.

Trace fluid within the left mastoid air cells.

ADDENDUM:
Post-contrast MR imaging of the brain was also performed. Apart from
a 2-3 mm aneurysm arising from the cavernous right internal carotid
artery (better appreciated on the concurrently performed MRA head),
no pathologic intracranial enhancement is identified.

*** End of Addendum ***
FINDINGS: Brain:

Cerebral volume is normal.

Mild multifocal T2 FLAIR hyperintense signal abnormality within the
cerebral white matter, nonspecific but compatible with chronic small
vessel ischemic disease.

There is no acute infarct.

No evidence of an intracranial mass.

No chronic intracranial blood products.

No extra-axial fluid collection.

No midline shift.

Vascular: Maintained flow voids within the proximal large arterial
vessels.

Skull and upper cervical spine: No focal suspicious marrow lesion.
Incompletely assessed cervical spondylosis.

Sinuses/Orbits: Visualized orbits show no acute finding. Small
mucous retention cyst within the right maxillary sinus. Minimal
mucosal thickening within the bilateral ethmoid sinuses.

Other: Trace fluid within the left mastoid air cells.
IMPRESSION: No evidence of acute intracranial abnormality.

Mild chronic small vessel ischemic changes within the cerebral white
matter, only slightly progressed from the brain MRI of [DATE].

Mild paranasal sinus disease, as described.

Trace fluid within the left mastoid air cells.

## 2021-04-21 IMAGING — MR MR MRA HEAD W/O CM
20 of 22 series · 41 of 48 positions shown · IV contrast (8 ML GADAVIST)
Comparison: MRI brain and MRA head [DATE].

CLINICAL DATA: Provided history: Worst headache of life. Neck pain.
Dizziness. Exertional headache, primary. Additional history provided
by scanning technologist: Patient reports episode of headache/neck
pain and dizziness 3-4 weeks ago.

EXAM:
MRA HEAD WITHOUT CONTRAST
TECHNIQUE: Angiographic images of the Circle of Willis were acquired using MRA
technique without intravenous contrast.

[Series 5: DWI · axial · 3.0mm · 1.36mm/px · z∈[-58,+83]mm · 2 of 96 slices shown (1 of 2)]
[im 1/96]
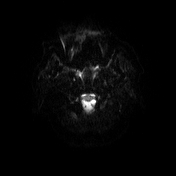
[im 96/96]
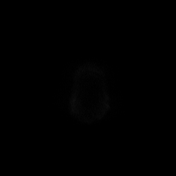

[Series 6: DWI · axial · 3.0mm · 1.36mm/px · 1 of 48 slices shown (2 of 2)]
[im 1/48]
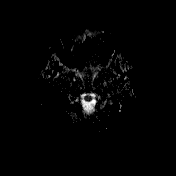

[Series 7: T1 · sagittal · 5.0mm · 0.75mm/px · 1 of 24 slices shown (1 of 2)]
[im 1/24]
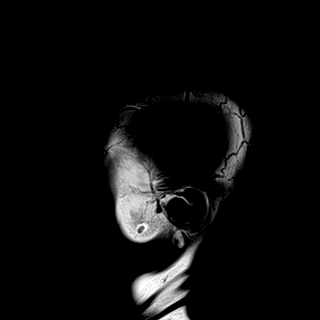

[Series 8: T2 · axial · 5.0mm · 0.62mm/px · 1 of 26 slices shown (1 of 2)]
[im 1/26]
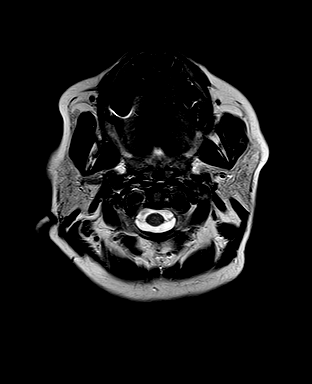

[Series 9: swi_images · axial · 3.0mm · 0.75mm/px · 1 of 56 slices shown]
[im 1/56]
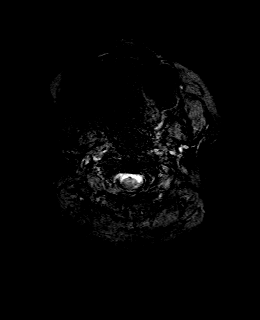

[Series 11: FLAIR · axial · 3.0mm · 0.75mm/px · 1 of 52 slices shown]
[im 1/52]
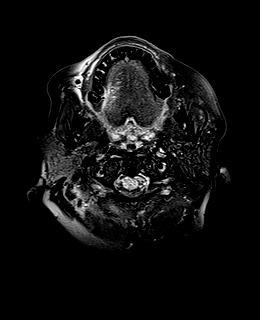

[Series 12: T1 · axial · 1.0mm · 0.94mm/px · z∈[-74,+85]mm · 5 of 160 slices shown (2 of 2)]
[im 1/160]
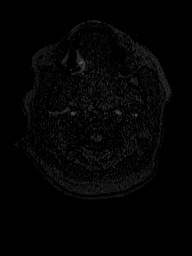
[im 40/160]
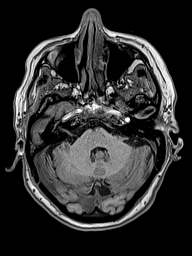
[im 80/160]
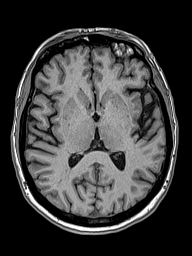
[im 120/160]
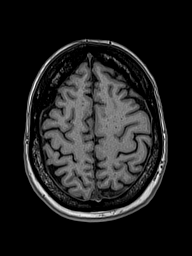
[im 160/160]
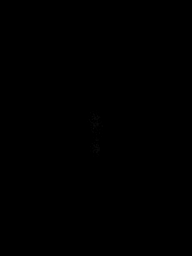

[Series 21: cor dwi_tracew · coronal · 5.0mm · 1.53mm/px · 2 of 56 slices shown]
[im 1/56]
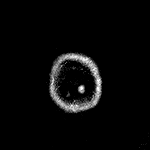
[im 56/56]
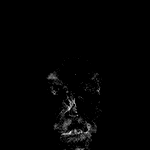

[Series 22: cor dwi_adc · coronal · 5.0mm · 1.53mm/px · 1 of 27 slices shown]
[im 1/27]
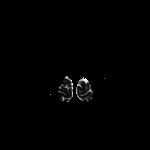

[Series 23: T2 · coronal · 5.0mm · 0.57mm/px · 1 of 32 slices shown (2 of 2)]
[im 1/32]
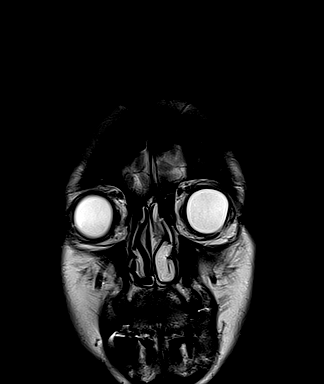

[Series 26: tof_2d_tra · axial · 3.5mm · 0.43mm/px · z∈[-218,-73]mm · 2 of 60 slices shown]
[im 1/60]
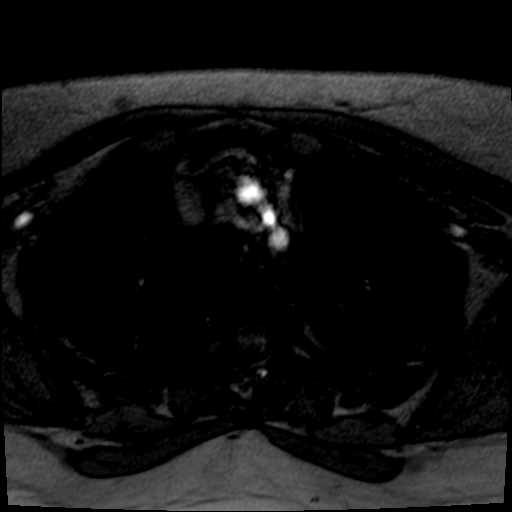
[im 60/60]
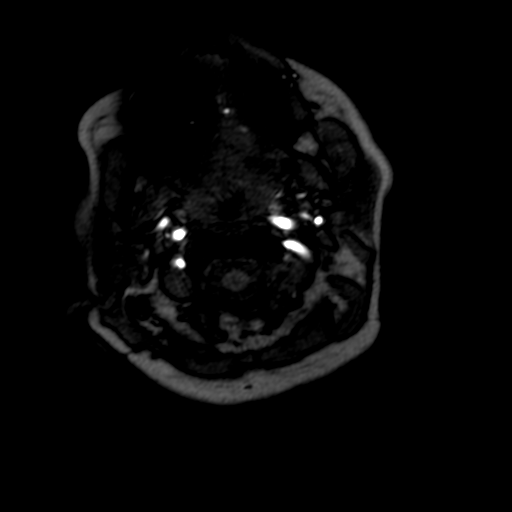

[Series 29: tof_2d_tra_mip_tra · axial · 148.0mm · 0.43mm/px · 1 of 1 slices shown]
[im 1/1]
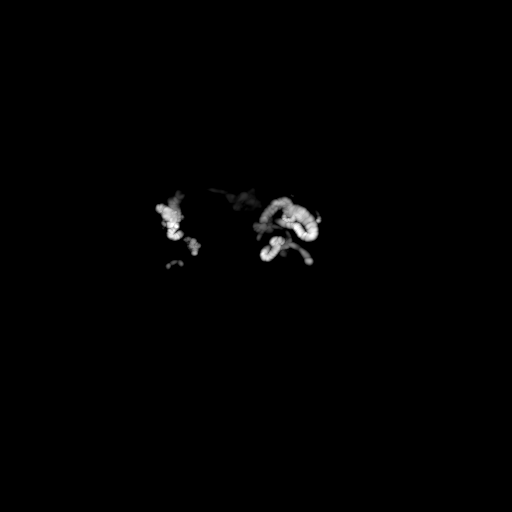

[Series 30: (id)_cor_pre · coronal · 0.8mm · 0.78mm/px · 3 of 96 slices shown]
[im 1/96]
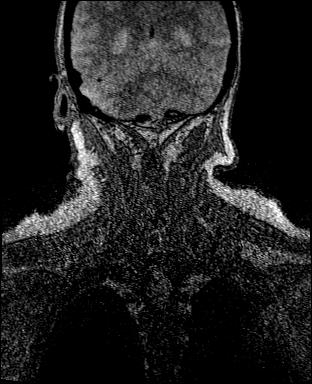
[im 48/96]
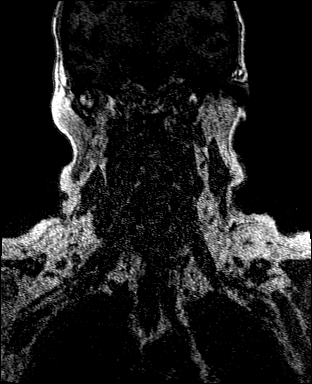
[im 96/96]
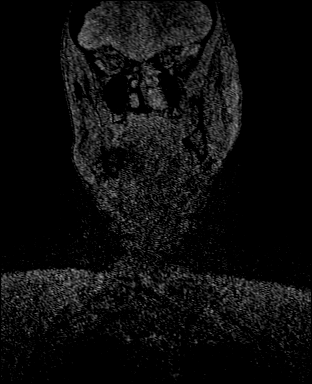

[Series 32: (id)_cor_post · coronal · 0.8mm · 0.78mm/px · 3 of 96 slices shown]
[im 1/96]
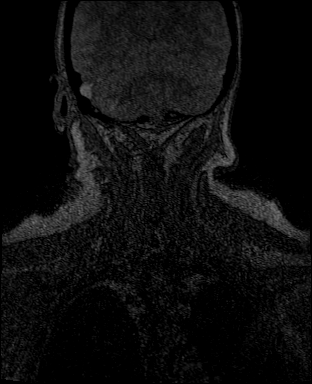
[im 48/96]
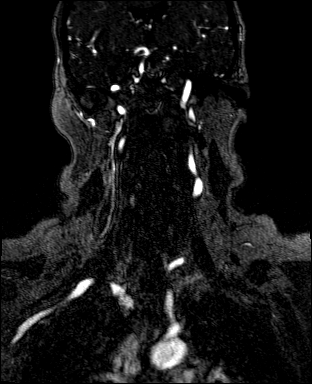
[im 96/96]
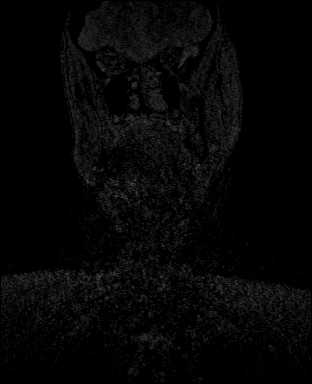

[Series 33: (id)_cor_post_sub · coronal · 0.8mm · 0.78mm/px · 3 of 89 slices shown]
[im 1/89]
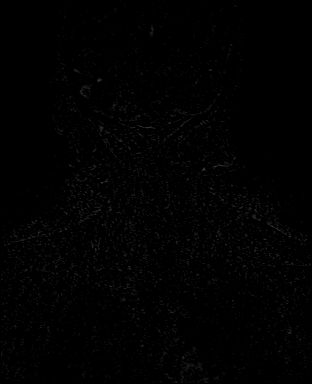
[im 45/89]
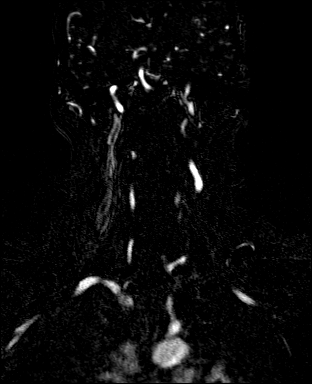
[im 89/89]
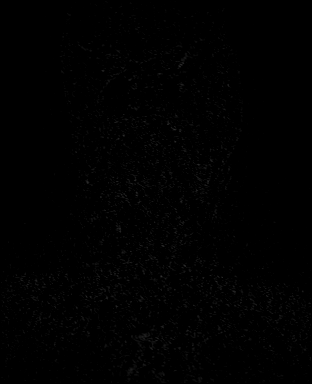

[Series 35: (id)_cor_post_venous · coronal · 0.8mm · 0.78mm/px · 3 of 96 slices shown]
[im 1/96]
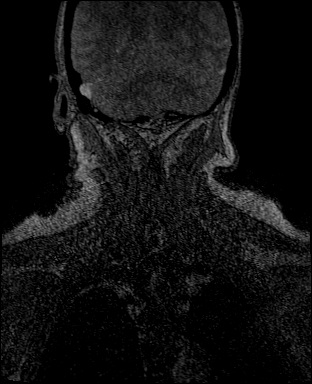
[im 48/96]
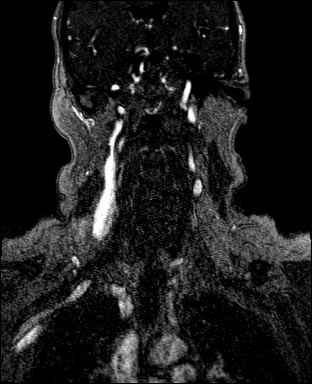
[im 96/96]
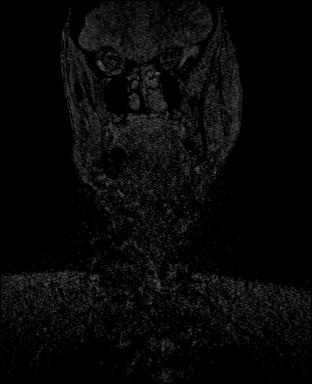

[Series 36: (id)_cor_post_venous_sub · coronal · 0.8mm · 0.78mm/px · 3 of 90 slices shown]
[im 1/90]
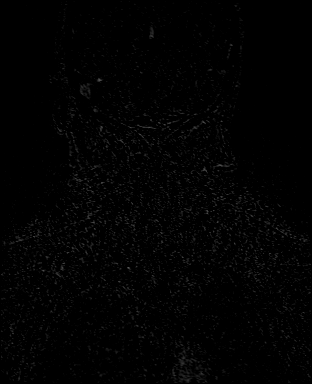
[im 45/90]
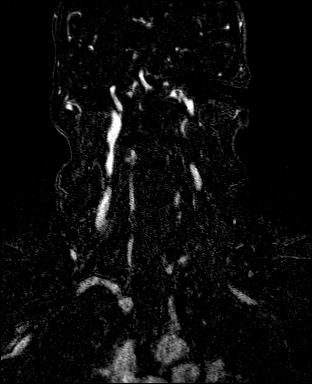
[im 90/90]
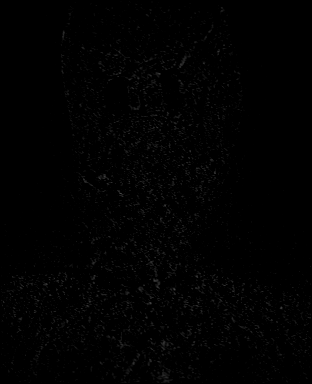

[Series 42: T1 post-contrast · axial · 1.0mm · 0.94mm/px · z∈[-57,+85]mm · 5 of 144 slices shown (1 of 3)]
[im 1/144]
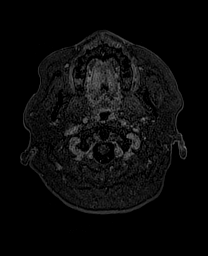
[im 36/144]
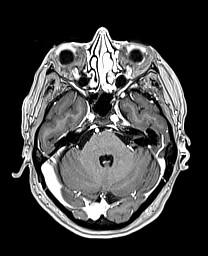
[im 72/144]
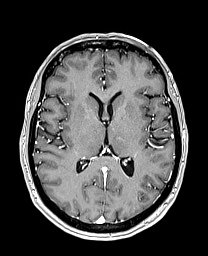
[im 108/144]
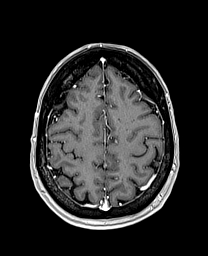
[im 144/144]
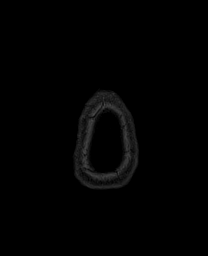

[Series 43: T1 post-contrast · coronal · 5.0mm · 0.43mm/px · 1 of 28 slices shown (2 of 3)]
[im 1/28]
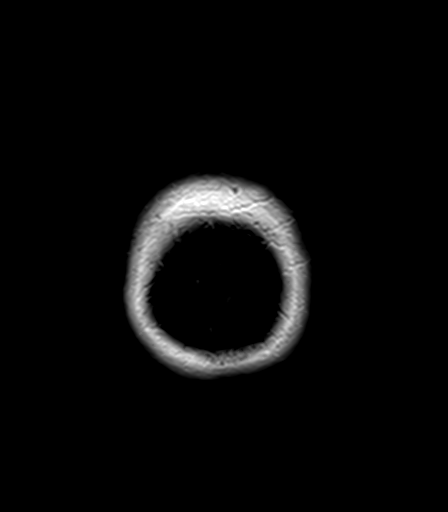

[Series 44: T1 post-contrast · sagittal · 5.0mm · 0.75mm/px · 1 of 24 slices shown (3 of 3)]
[im 1/24]
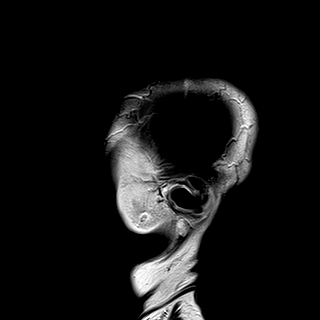

[41 of 48 positions shown; findings below may reference images not displayed]

FINDINGS: Anterior circulation:

The intracranial internal carotid arteries are patent. Mild
atherosclerotic irregularity of the cavernous/paraclinoid right ICA.
The M1 middle cerebral arteries are patent. No M2 proximal branch
occlusion or high-grade proximal stenosis is identified. The
anterior cerebral arteries are patent. 2-3 mm inferomedially
projecting aneurysm arising from the cavernous right ICA, unchanged
in size as compared to the MRA head of [DATE] (for instance as
seen on series 16, image 97) (series 104, image 3).

Posterior circulation:

The intracranial vertebral arteries are patent. The basilar artery
is patent. The posterior cerebral arteries are patent. Posterior
communicating arteries are diminutive or absent bilaterally.

Anatomic variants: As described.
IMPRESSION: 2-3 mm inferomedially projecting aneurysm arising from the cavernous
right ICA, unchanged in size as compared to the MRA head of
[DATE].

No intracranial large vessel occlusion or proximal high-grade
arterial stenosis.

Mild atherosclerotic irregularity of the cavernous/paraclinoid right
ICA.

## 2021-04-21 IMAGING — MR MR MRA NECK WO/W CM
20 of 22 series · 41 of 48 positions shown · IV contrast (8 ML GADAVIST)
Comparison: Report from MRA neck [DATE] (images unavailable).

CLINICAL DATA: Provided history: Worst headache of life. Neck pain.
Dizziness. Exertional headache, primary. Additional history provided
by scanning technologist: Patient reports episode of headache/neck
pain and dizziness 3-4 weeks ago.

EXAM:
MRA NECK WITHOUT AND WITH CONTRAST
TECHNIQUE: Multiplanar and multiecho pulse sequences of the neck were obtained
without and with intravenous contrast. Angiographic images of the
neck were obtained using MRA technique without and with intravenous
contrast.
CONTRAST:  8mL GADAVIST GADOBUTROL 1 MMOL/ML IV SOLN

[Series 5: DWI · axial · 3.0mm · 1.36mm/px · z∈[-58,+83]mm · 2 of 96 slices shown (1 of 2)]
[im 1/96]
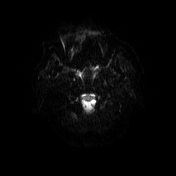
[im 96/96]
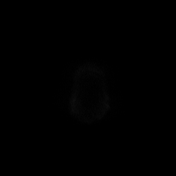

[Series 6: DWI · axial · 3.0mm · 1.36mm/px · 1 of 48 slices shown (2 of 2)]
[im 1/48]
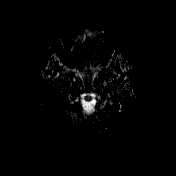

[Series 7: T1 · sagittal · 5.0mm · 0.75mm/px · 1 of 24 slices shown (1 of 2)]
[im 1/24]
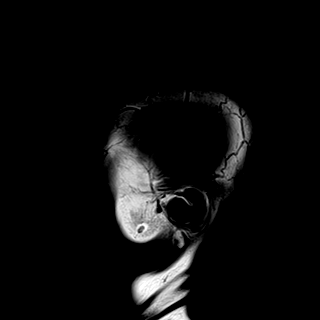

[Series 8: T2 · axial · 5.0mm · 0.62mm/px · 1 of 26 slices shown (1 of 2)]
[im 1/26]
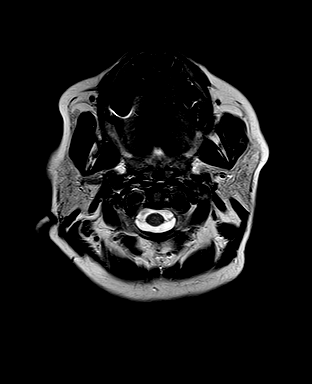

[Series 9: swi_images · axial · 3.0mm · 0.75mm/px · 1 of 56 slices shown]
[im 1/56]
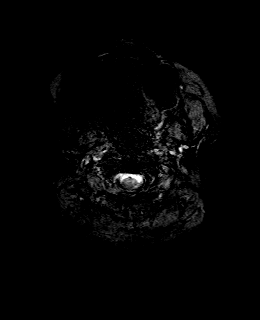

[Series 11: FLAIR · axial · 3.0mm · 0.75mm/px · 1 of 52 slices shown]
[im 1/52]
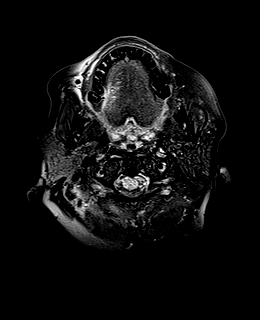

[Series 12: T1 · axial · 1.0mm · 0.94mm/px · z∈[-74,+85]mm · 5 of 160 slices shown (2 of 2)]
[im 1/160]
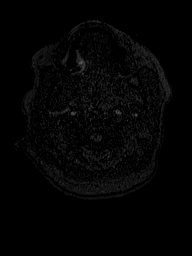
[im 40/160]
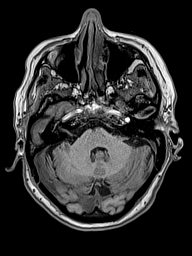
[im 80/160]
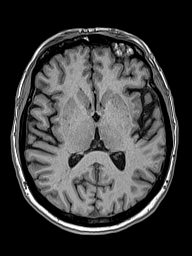
[im 120/160]
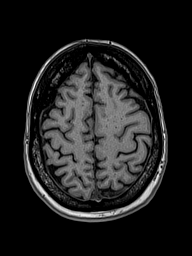
[im 160/160]
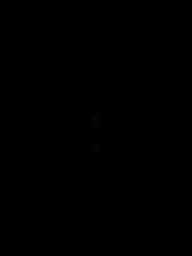

[Series 21: cor dwi_tracew · coronal · 5.0mm · 1.53mm/px · 2 of 56 slices shown]
[im 1/56]
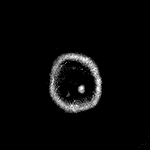
[im 56/56]
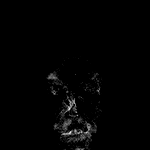

[Series 22: cor dwi_adc · coronal · 5.0mm · 1.53mm/px · 1 of 27 slices shown]
[im 1/27]
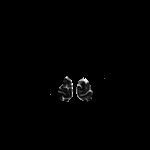

[Series 23: T2 · coronal · 5.0mm · 0.57mm/px · 1 of 32 slices shown (2 of 2)]
[im 1/32]
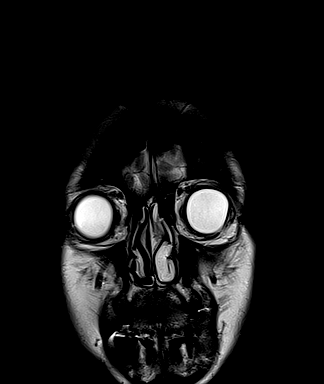

[Series 26: tof_2d_tra · axial · 3.5mm · 0.43mm/px · z∈[-218,-73]mm · 2 of 60 slices shown]
[im 1/60]
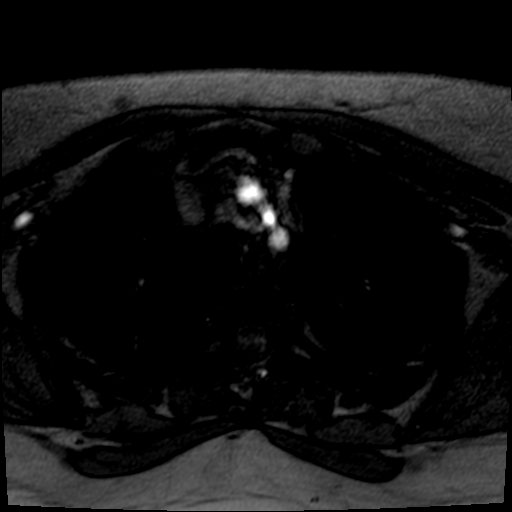
[im 60/60]
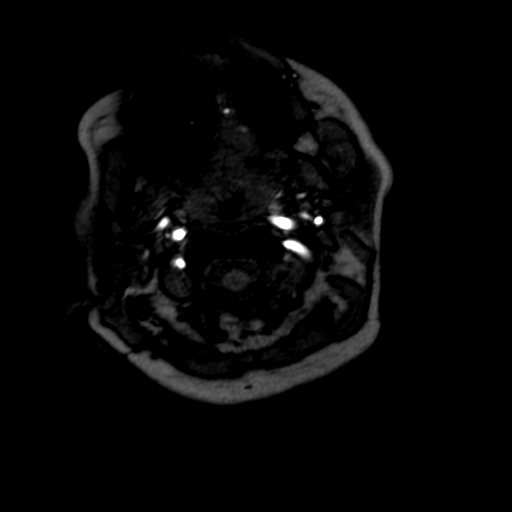

[Series 29: tof_2d_tra_mip_tra · axial · 148.0mm · 0.43mm/px · 1 of 1 slices shown]
[im 1/1]
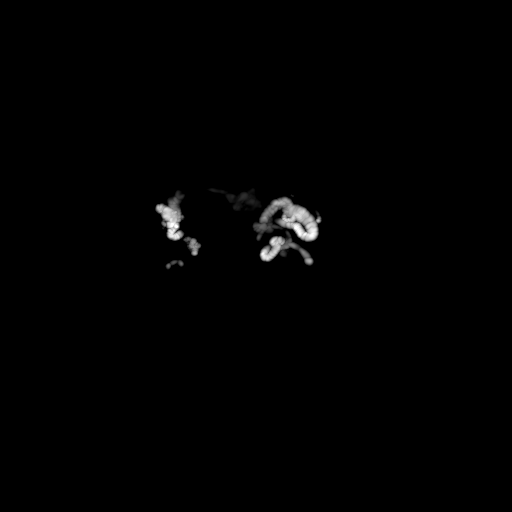

[Series 30: (id)_cor_pre · coronal · 0.8mm · 0.78mm/px · 3 of 96 slices shown]
[im 1/96]
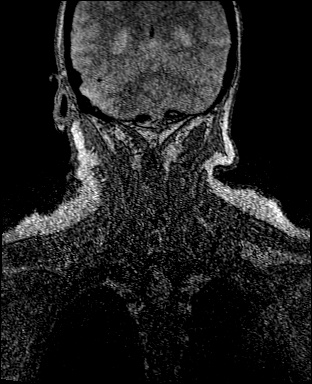
[im 48/96]
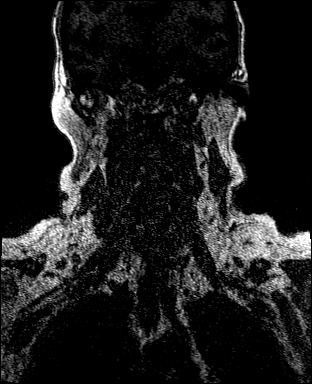
[im 96/96]
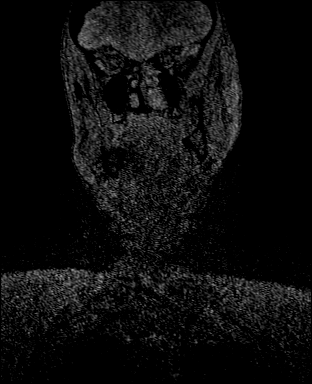

[Series 32: (id)_cor_post · coronal · 0.8mm · 0.78mm/px · 3 of 96 slices shown]
[im 1/96]
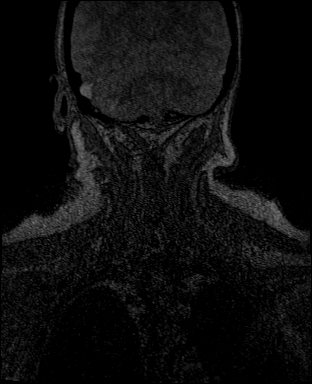
[im 48/96]
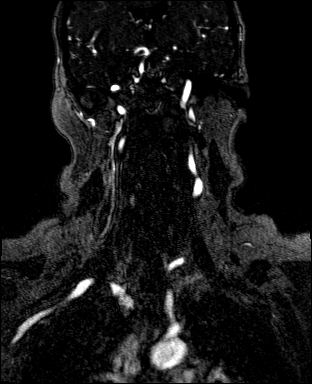
[im 96/96]
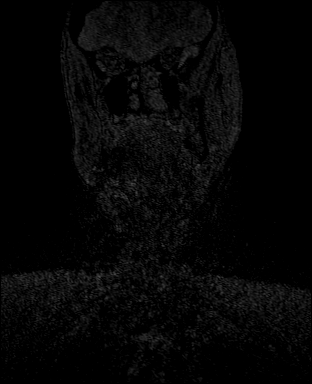

[Series 33: (id)_cor_post_sub · coronal · 0.8mm · 0.78mm/px · 3 of 89 slices shown]
[im 1/89]
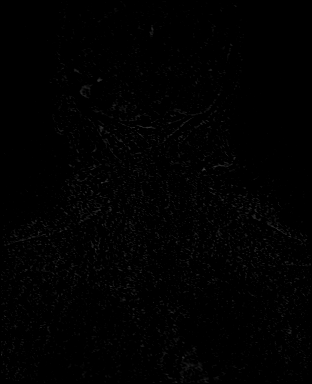
[im 45/89]
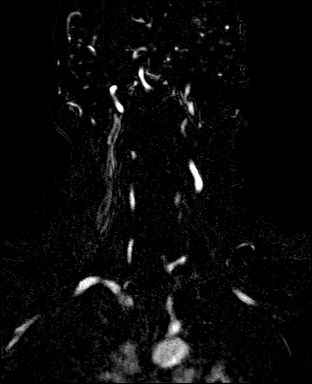
[im 89/89]
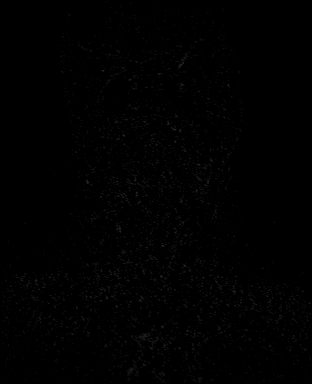

[Series 35: (id)_cor_post_venous · coronal · 0.8mm · 0.78mm/px · 3 of 96 slices shown]
[im 1/96]
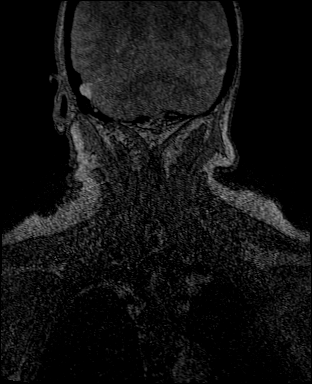
[im 48/96]
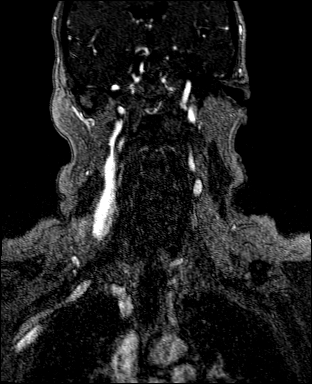
[im 96/96]
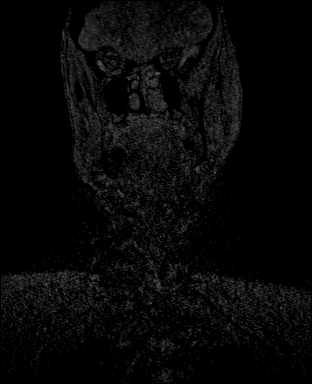

[Series 36: (id)_cor_post_venous_sub · coronal · 0.8mm · 0.78mm/px · 3 of 90 slices shown]
[im 1/90]
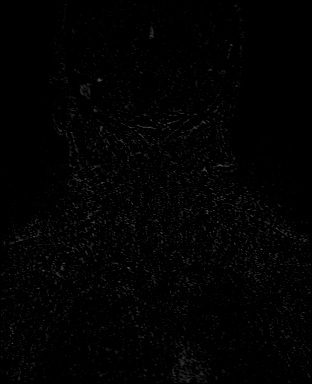
[im 45/90]
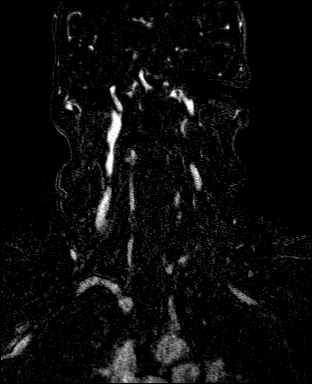
[im 90/90]
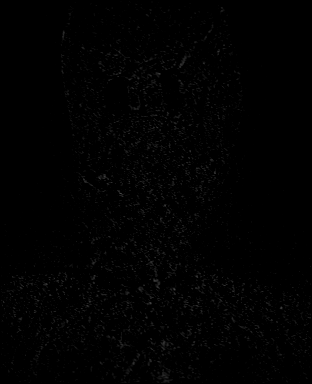

[Series 42: T1 post-contrast · axial · 1.0mm · 0.94mm/px · z∈[-57,+85]mm · 5 of 144 slices shown (1 of 3)]
[im 1/144]
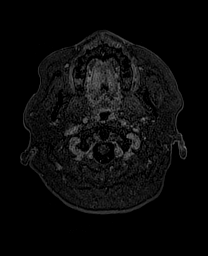
[im 36/144]
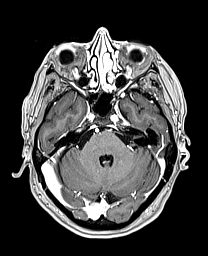
[im 72/144]
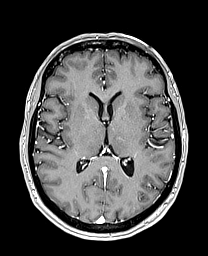
[im 108/144]
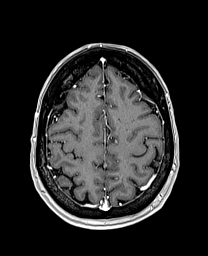
[im 144/144]
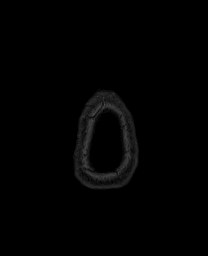

[Series 43: T1 post-contrast · coronal · 5.0mm · 0.43mm/px · 1 of 28 slices shown (2 of 3)]
[im 1/28]
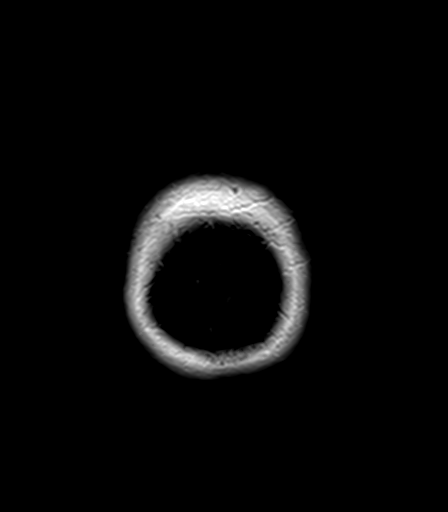

[Series 44: T1 post-contrast · sagittal · 5.0mm · 0.75mm/px · 1 of 24 slices shown (3 of 3)]
[im 1/24]
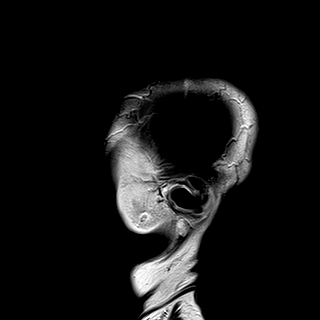

[41 of 48 positions shown; findings below may reference images not displayed]

FINDINGS: Common origin of the innominate and left common carotid arteries.
The visualized aortic arch is normal in caliber. No hemodynamically
significant innominate or proximal subclavian artery stenosis.

The common carotid and internal carotid arteries are patent within
the neck without appreciable stenosis.

The vertebral arteries are patent within the neck. Apparent mild
stenosis at the origin of the non-dominant right vertebral artery.
IMPRESSION: The common carotid and internal carotid arteries are patent within
the neck without stenosis.

The vertebral arteries are patent within the neck. Apparent mild
stenosis at the origin of the non-dominant right vertebral artery.

## 2021-04-21 MED ORDER — GADOBUTROL 1 MMOL/ML IV SOLN
8.0000 mL | Freq: Once | INTRAVENOUS | Status: AC | PRN
  Administered 2021-04-21: 8 mL via INTRAVENOUS

## 2021-04-26 ENCOUNTER — Ambulatory Visit: Payer: Medicare HMO | Admitting: Cardiology

## 2021-04-26 ENCOUNTER — Encounter: Payer: Self-pay | Admitting: Cardiology

## 2021-04-26 ENCOUNTER — Other Ambulatory Visit: Payer: Self-pay

## 2021-04-26 VITALS — BP 138/72 | HR 58 | Temp 98.0°F | Resp 17 | Ht 63.0 in | Wt 172.6 lb

## 2021-04-26 DIAGNOSIS — I10 Essential (primary) hypertension: Secondary | ICD-10-CM

## 2021-04-26 DIAGNOSIS — I6523 Occlusion and stenosis of bilateral carotid arteries: Secondary | ICD-10-CM

## 2021-04-26 DIAGNOSIS — M542 Cervicalgia: Secondary | ICD-10-CM

## 2021-04-26 DIAGNOSIS — E7800 Pure hypercholesterolemia, unspecified: Secondary | ICD-10-CM

## 2021-04-26 DIAGNOSIS — Q2112 Patent foramen ovale: Secondary | ICD-10-CM

## 2021-04-26 MED ORDER — EZETIMIBE 10 MG PO TABS: 10.0000 mg | ORAL_TABLET | Freq: Every day | ORAL | 0 refills | Status: DC

## 2021-04-26 MED ORDER — ROSUVASTATIN CALCIUM 20 MG PO TABS: 20.0000 mg | ORAL_TABLET | Freq: Every day | ORAL | 0 refills | Status: AC

## 2021-04-26 NOTE — Progress Notes (Signed)
Primary Physician/Referring:  Aletha Halim., PA-C  Patient ID: Bridget Phelps, female    DOB: 09/26/1950, 71 y.o.   MRN: [id number]  Chief Complaint  Patient presents with   New Patient (Initial Visit)   Bradycardia   PFO   HPI:    Bridget Phelps  is a 71 y.o. Caucasian female patient with hypertension, hyperlipidemia, premature coronary disease with sister having died at the age of 68 with massive myocardial infarction, small PFO by TEE in 2016, recurrent occipital headache, OSA presents for reestablishing cardiac care, I had last seen her in 2017.  She is referred back to me by Bing Matter, PA-C She is referred back to me by Bing Matter, PA-C for evaluation of exertional neck pain and cardiac risk stratification.  Patient symptoms have been ongoing for the past 6 to 8 months, but has been getting worse over the last 1 to 2 months.  Every time she exerts she gets severe neck pain.  She gets relief by resting.  But she has also had sharp neck pain especially when she moves her neck and has been getting worse.  Denies dyspnea, palpitations, symptoms of TIA or claudication.  Past Medical History:  Diagnosis Date   GERD (gastroesophageal reflux disease)    Hypercholesterolemia    no Rx meds   Hypertension    controlled on lisinopril/hctz   Sleep apnea    does have cpap   Tubular adenoma of colon 07/2011   Past Surgical History:  Procedure Laterality Date   ABDOMINAL HYSTERECTOMY  1991   BUNIONECTOMY Bilateral 2012   TEE WITHOUT CARDIOVERSION N/A 03/28/2015   Procedure: TRANSESOPHAGEAL ECHOCARDIOGRAM (TEE);  Surgeon: [name], MD;  Location: [facility] ENDOSCOPY;  Service: Cardiovascular;  Laterality: N/A;   [place]   Family History  Problem Relation Age of Onset   Stroke Mother    Diabetes Mother    CAD Father    Stroke Father    Lung cancer Father    Diabetes Sister    Heart attack Sister    Colon cancer Maternal Aunt 70   Diabetes Maternal  Grandmother    Colon cancer Maternal Grandmother        greatgrand   Breast cancer Maternal Grandmother    Stroke Maternal Grandfather    Diabetes Paternal Grandmother    Esophageal cancer Neg Hx    Stomach cancer Neg Hx    Rectal cancer Neg Hx     Social History   Tobacco Use   Smoking status: Never   Smokeless tobacco: Never  Substance Use Topics   Alcohol use: Yes    Alcohol/week: 7.0 standard drinks    Types: 7 Glasses of wine per week    Comment: OCC   Marital Status: Married  ROS  Review of Systems  Cardiovascular:  Negative for chest pain, dyspnea on exertion and leg swelling.  Gastrointestinal:  Negative for melena.  Objective  Blood pressure 138/72, pulse (!) 58, temperature 98 F (36.7 C), temperature source Temporal, resp. rate 17, height 5\' 3"  (1.6 m), weight 172 lb 9.6 oz (78.3 kg), SpO2 95 %. Body mass index is 30.57 kg/m.  Vitals with BMI 04/26/2021 08/03/2020 08/21/2016  Height 5\' 3"  5\' 3"  -  Weight 172 lbs 10 oz 176 lbs -  BMI 57.32 20.25 -  Systolic 427 062 376  Diastolic 72 78 68  Pulse 58 66 55    Physical Exam Neck:     Vascular: No carotid  bruit or JVD.  Cardiovascular:     Rate and Rhythm: Normal rate and regular rhythm.     Pulses: Intact distal pulses.     Heart sounds: Normal heart sounds. No murmur heard.   No gallop.  Pulmonary:     Effort: Pulmonary effort is normal.     Breath sounds: Normal breath sounds.  Abdominal:     General: Bowel sounds are normal.     Palpations: Abdomen is soft.  Musculoskeletal:        General: No swelling.     Laboratory examination:   External labs:   Labs 03/30/2021:  Potassium 3.5, BUN 15, creatinine 0.57, CMP otherwise normal.  A1c 6.5%.  TSH normal.  Hb 13.3/HCT 38.0, platelets 242.  Total cholesterol 278, triglycerides 169, HDL 47, LDL direct 204. Medications and allergies   Allergies  Allergen Reactions   Sulfa Antibiotics Rash     Medication prior to this encounter:   Outpatient  Medications Prior to Visit  Medication Sig Dispense Refill   Bee Pollen 1000 MG TABS Take by mouth.     brimonidine-timolol (COMBIGAN) 0.2-0.5 % ophthalmic solution Place 1 drop into both eyes every 12 (twelve) hours.     losartan-hydrochlorothiazide (HYZAAR) 50-12.5 MG tablet Take 1 tablet by mouth daily.     Multiple Vitamins-Minerals (WOMENS MULTIVITAMIN + COLLAGEN PO) Take 1 capsule by mouth daily. MULTI COLLAGEN PROTEIN     psyllium (METAMUCIL SMOOTH TEXTURE) 28 % packet Take 1 packet by mouth daily.     rosuvastatin (CRESTOR) 10 MG tablet Take 10 mg by mouth daily.     Coenzyme Q10 (CO Q 10) 100 MG CAPS Take by mouth.     Krill Oil 300 MG CAPS Take 1 capsule by mouth daily.     No facility-administered medications prior to visit.     Medication list after today's encounter   Current Outpatient Medications  Medication Instructions   Bee Pollen 1000 MG TABS Take by mouth.   brimonidine-timolol (COMBIGAN) 0.2-0.5 % ophthalmic solution 1 drop, Both Eyes, Every 12 hours   ezetimibe (ZETIA) 10 mg, Oral, Daily after supper   losartan-hydrochlorothiazide (HYZAAR) 50-12.5 MG tablet 1 tablet, Oral, Daily   Multiple Vitamins-Minerals (WOMENS MULTIVITAMIN + COLLAGEN PO) 1 capsule, Oral, Daily, MULTI COLLAGEN PROTEIN   psyllium (METAMUCIL SMOOTH TEXTURE) 28 % packet 1 packet, Oral, Daily   rosuvastatin (CRESTOR) 20 mg, Oral, Daily    Radiology:   MRI of the brain and MR angio of the head and neck 04/21/2021: The common carotid and internal carotid arteries are patent within the neck without stenosis. The vertebral arteries are patent within the neck. Apparent mild stenosis at the origin of the non-dominant right vertebral artery.  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  X-Ray C Spine 03/30/2021: 1. No acute fracture or  dislocation. No listhesis.  2. Moderate to severe multilevel chronic cervical spondylosis with moderate to high-grade intervertebral disc height loss at C3-4, C4-5 and C5-6 with additional multilevel disc height narrowing. Bulky ventral endplate spurring from C3 through C6.  3. No prevertebral soft tissue edema.  Cardiac Studies:   Echocardiogram 03/08/2015: 1. Left ventricle cavity is normal in size. Mild concentric hypertrophy of the left ventricle. Normal global wall motion. Doppler evidence of grade I (impaired) diastolic dysfunction. Calculated EF 59%. 2. Left atrial cavity is moderately dilated. 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  Consider TEE to better evaluate the structures. 3. Right ventricle cavity is mildly dilated. Mild concentric hypertrophy of the right ventricle. Normal right ventricular function. 4. Trace tricuspid regurgitation. Unable to estimate PA pressure due to absence/minimal TR signal. Impression: EKG 09/27/2015: Sinus bradycardia at a rate of 50 bpm, normal axis, normal intervals, no evidence of ischemia. Essentially normal EKG. No significant change from EKG on 02/23/2015.  Exercise myoview stress 02/25/2015: 1. The resting electrocardiogram demonstrated normal sinus rhythm, normal resting conduction and no resting arrhythmias.  The stress electrocardiogram was normal.  The patient performed treadmill exercise using a Bruce protocol, completing 7:44 minutes. The patient completed an estimated workload of 9.74 METS, 95% of the maximum predicted heart rate. The stress test was terminated because of fatigue. 2. Myocardial perfusion imaging is normal. Overall left ventricular systolic function was normal without regional wall motion abnormalities. The left ventricular ejection fraction was 74%.  Carotid artery duplex 07/20/2015: Color duplex indicates  minimal heterogeneous plaque, with no hemodynamically significant stenosis by duplex criteria in the extracranial cerebrovascular circulation.  TEE 03/28/2015: - Left ventricle: Systolic function was normal. Wall motion was   normal; there were no regional wall motion abnormalities.  - Aorta: There was mild, non-ulcerated atheromatous plaque in the  aortic arch, extending to the proximal descending aorta.  - Left atrium: No evidence of thrombus in the atrial cavity or   appendage.  - Right atrium: No evidence of thrombus in the atrial cavity or   appendage.  - Atrial septum: There was a very small patent foramen ovale.   Agitated saline contrast study showed a trivial right-to-left    shunt, following an increase in RA pressure induced by the   Valsalva maneuver.  EKG:   EKG 04/26/2021: Normal sinus rhythm at rate of 52 bpm, normal axis, incomplete right bundle branch block.  No acute ischemia, normal EKG.    Assessment     ICD-10-CM   1. Essential hypertension  I10 EKG 12-Lead    PCV ECHOCARDIOGRAM COMPLETE    2. PFO (patent foramen ovale)  Q21.12     3. Pure hypercholesterolemia  E78.00 rosuvastatin (CRESTOR) 20 MG tablet    ezetimibe (ZETIA) 10 MG tablet    PCV MYOCARDIAL PERFUSION WITH LEXISCAN    4. Neck pain  M54.2 PCV MYOCARDIAL PERFUSION WITH LEXISCAN    5. Atherosclerosis of both carotid arteries  I65.23 PCV MYOCARDIAL PERFUSION WITH LEXISCAN      Medications Discontinued During This Encounter  Medication Reason   Krill Oil 300 MG CAPS    Coenzyme Q10 (CO Q 10) 100 MG CAPS    rosuvastatin (CRESTOR) 10 MG tablet Reorder    Meds ordered this encounter  Medications   rosuvastatin (CRESTOR) 20 MG tablet    Sig: Take 1 tablet (20 mg total) by mouth daily.    Dispense:  90 tablet    Refill:  0    Refill request to Bing Matter, PA-C   ezetimibe (ZETIA) 10 MG tablet    Sig: Take 1 tablet (10 mg total) by mouth daily after supper.    Dispense:  90 tablet    Refill:   0    Refill requests to Leonia Reader, PA-C   Orders Placed This Encounter  Procedures   PCV MYOCARDIAL PERFUSION WITH LEXISCAN    Standing Status:   Future    Standing Expiration Date:   06/24/2021  EKG 12-Lead   PCV ECHOCARDIOGRAM COMPLETE    Standing Status:   Future    Standing Expiration Date:   04/26/2022   Recommendations:   Daje Stark is a 71 y.o. Caucasian female patient with hypertension, hyperlipidemia, premature coronary disease with sister having died at the age of 37 with massive myocardial infarction, small PFO by TEE in 2016, recurrent occipital headache, OSA presents for reestablishing cardiac care, I had last seen her in 2017.  She is referred back to me by Bing Matter, PA-C for evaluation of exertional neck pain and cardiac risk stratification.  Her neck pain is musculoskeletal most probably.  However she gets exertional neck pain in the back of her head and is relieved with rest and hence I cannot exclude anginal equivalent as well.  I would like to set her up for a Lexiscan nuclear stress test, I reviewed her x-ray of the spine, she has significant degenerative spine disease as well and hence I suspect she probably will need surgery if cardiac ischemia is ruled out.  Her cardiovascular risk factors include hypertension, severely elevated LDL which was not treated until now, carotid atherosclerosis and also mild vertebral disease noted on MRI.  With regard to PFO, although TEE had revealed only a small PFO, there was right ventricular dilatation noted on TTE.  I would like to repeat transthoracic echocardiogram.  With regard to hyperlipidemia, to reduce her overall CV risk, will increase Crestor from 10 mg to 20 mg and I have also added Zetia 10 mg daily, goal LDL at least <100 if not 70 which I prefer.  She has an appointment to follow-up with Bing Matter, PA-C with further management of hyperlipidemia.  I will see her back in 6 weeks for follow-up.    [name], MD, [facility] 04/26/2021, 10:23 AM Office: [phone]

## 2021-05-16 ENCOUNTER — Other Ambulatory Visit (HOSPITAL_COMMUNITY): Payer: Self-pay | Admitting: Neurosurgery

## 2021-05-16 ENCOUNTER — Other Ambulatory Visit: Payer: Self-pay | Admitting: Neurosurgery

## 2021-05-16 DIAGNOSIS — M542 Cervicalgia: Secondary | ICD-10-CM

## 2021-05-17 ENCOUNTER — Ambulatory Visit
Admission: RE | Admit: 2021-05-17 | Discharge: 2021-05-17 | Disposition: A | Discharge status: Home / Self Care | Payer: Medicare HMO | Source: Ambulatory Visit | Attending: Family Medicine | Admitting: Family Medicine

## 2021-05-17 ENCOUNTER — Other Ambulatory Visit: Payer: Self-pay

## 2021-05-17 DIAGNOSIS — Z1231 Encounter for screening mammogram for malignant neoplasm of breast: Secondary | ICD-10-CM

## 2021-05-17 IMAGING — MG MM DIGITAL SCREENING BILAT W/ TOMO AND CAD
8 series · 8 of 24 positions shown · non-contrast
Comparison: Previous exam(s).

ACR Breast Density Category a: The breast tissue is almost entirely
fatty.

CLINICAL DATA: Screening.

EXAM:
DIGITAL SCREENING BILATERAL MAMMOGRAM WITH TOMOSYNTHESIS AND CAD
TECHNIQUE: Bilateral screening digital craniocaudal and mediolateral oblique
mammograms were obtained. Bilateral screening digital breast
tomosynthesis was performed. The images were evaluated with
computer-aided detection.

[L CC synth-2D]
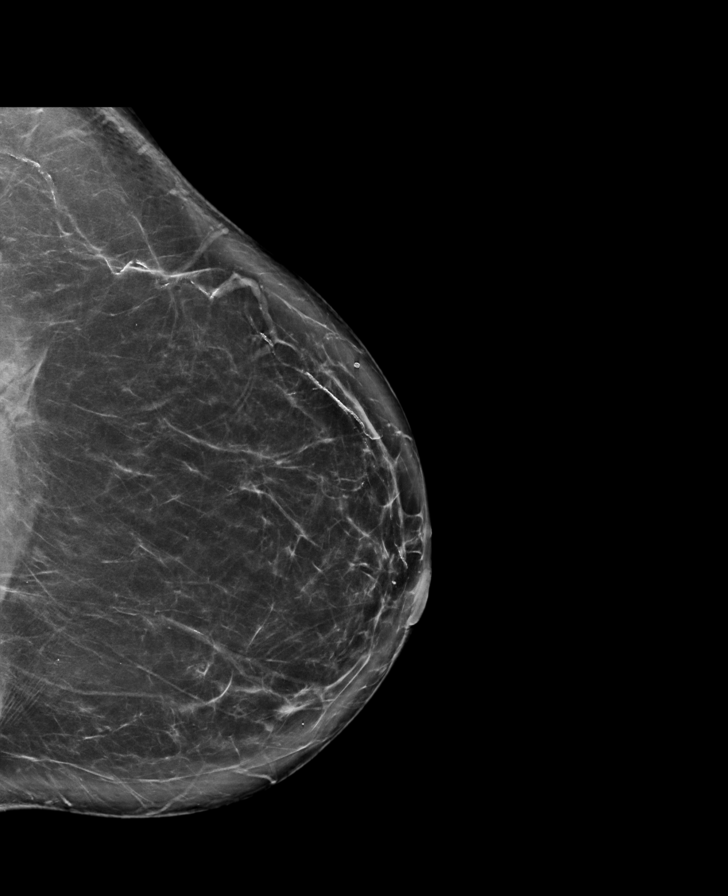

[R CC synth-2D]
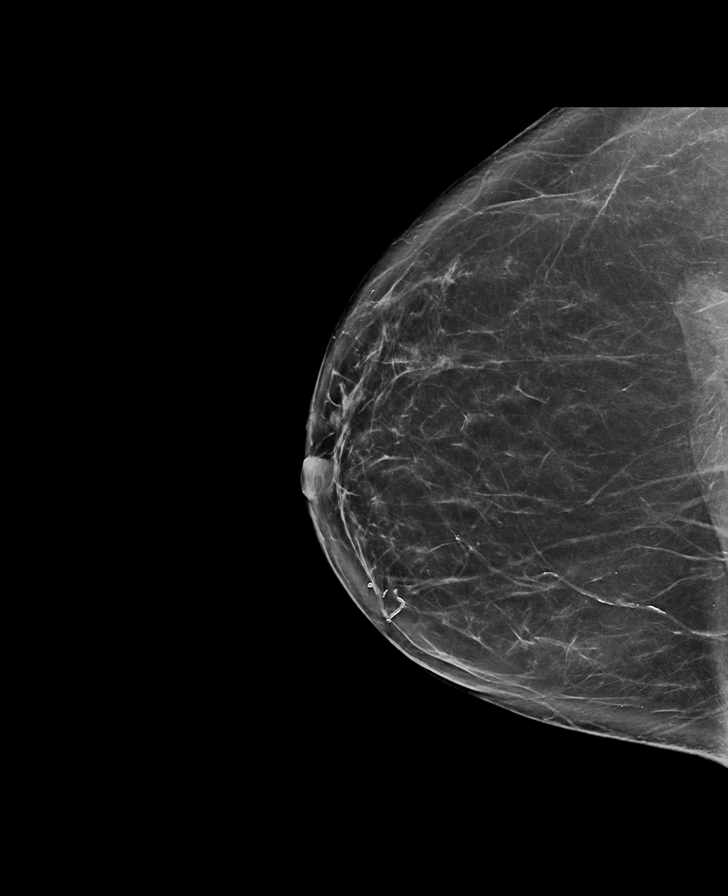

[R MLO synth-2D]
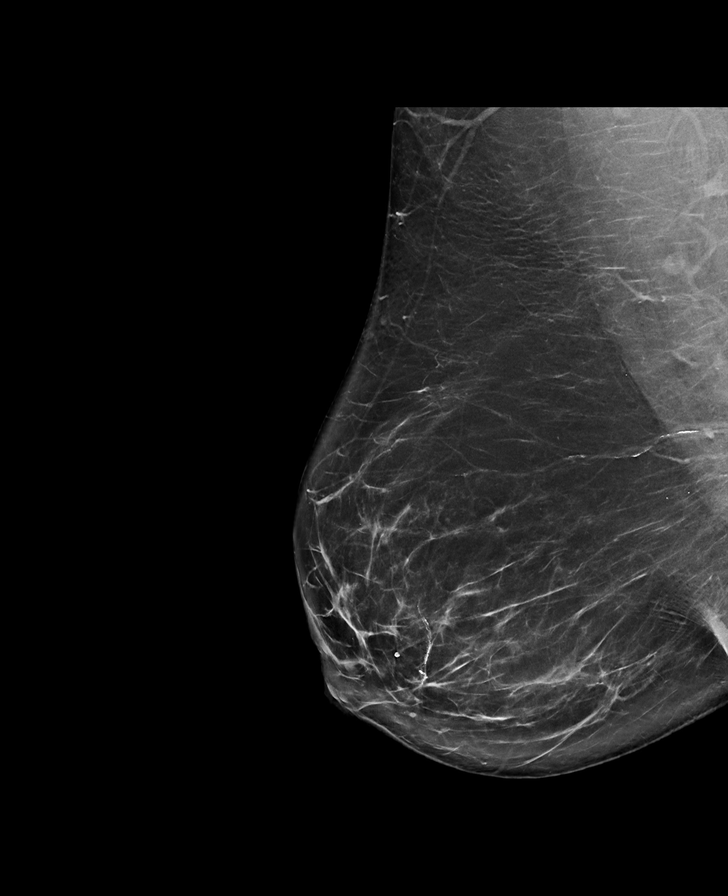

[L MLO synth-2D]
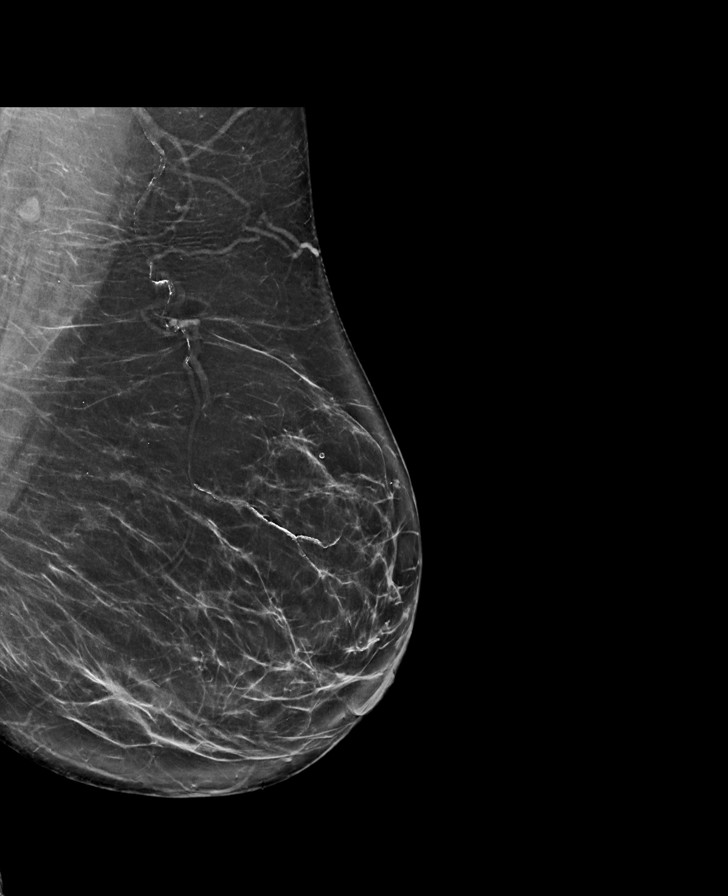

[L CC tomo · tomo slice 45/90.0]
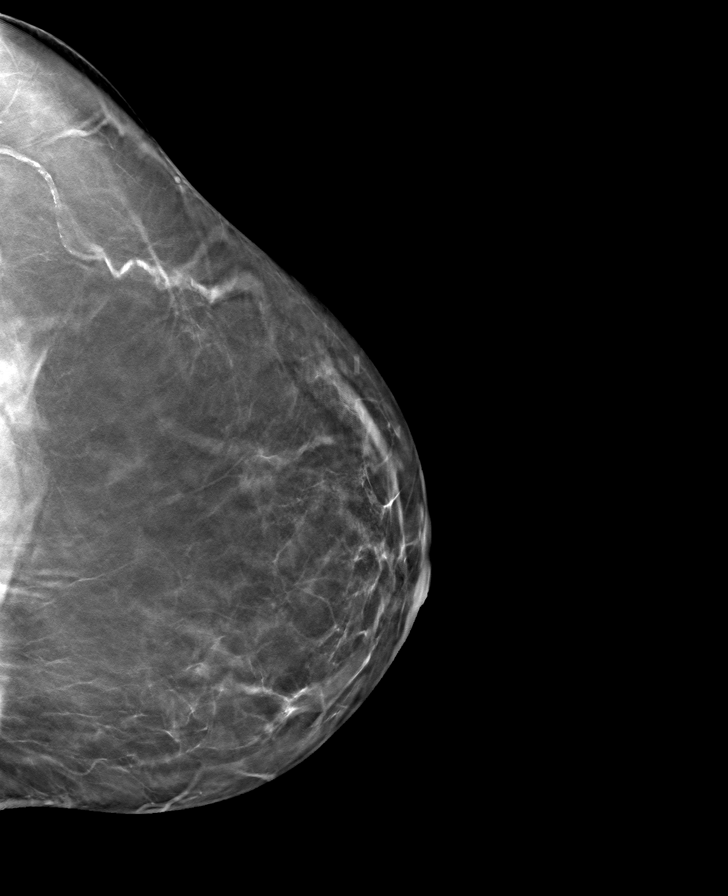

[R MLO tomo · tomo slice 46/91.0]
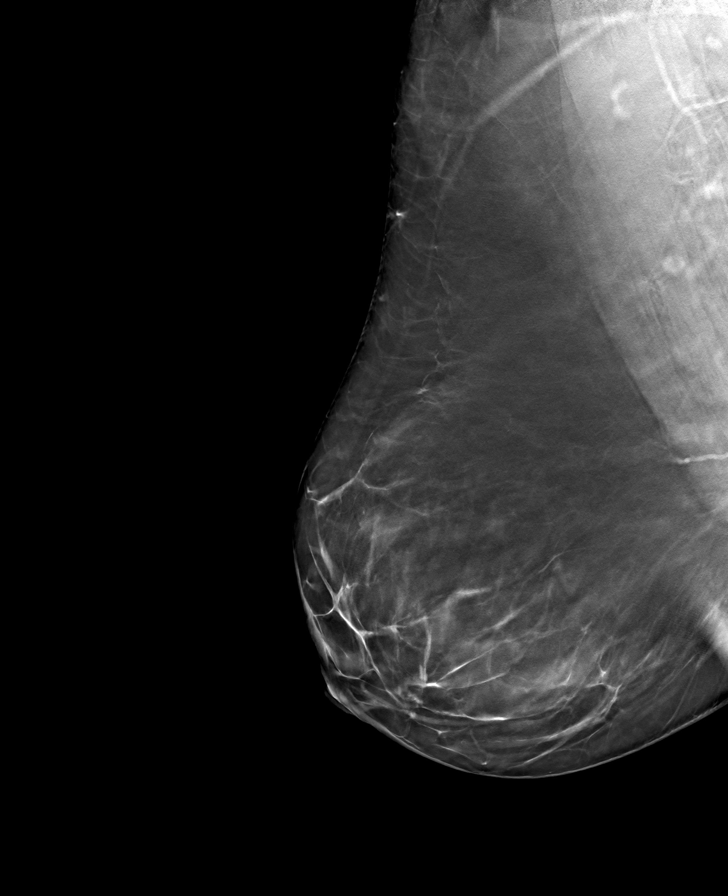

[L MLO tomo · tomo slice 45/89.0]
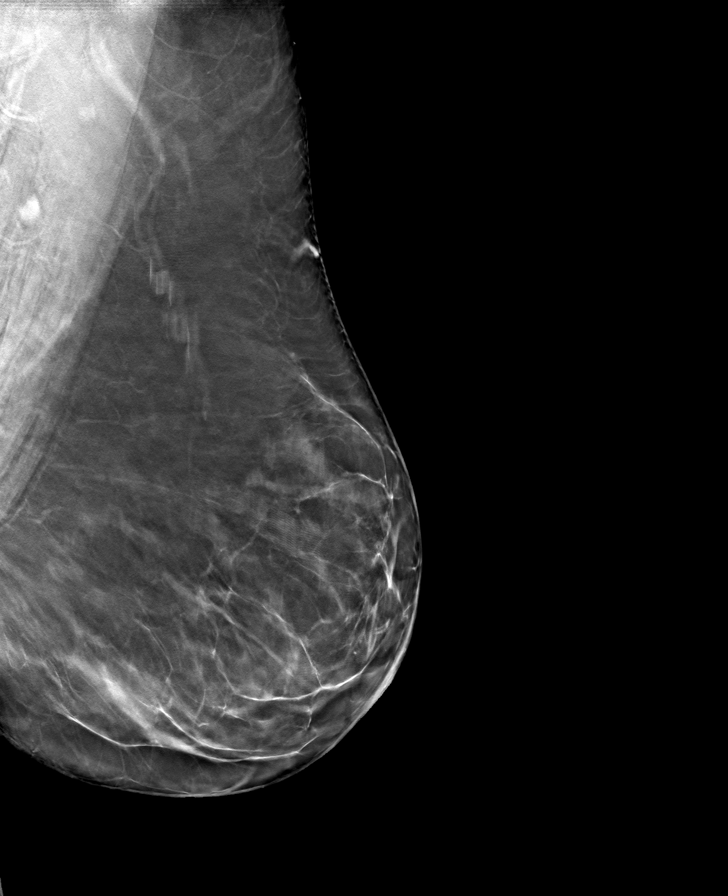

[R CC tomo · tomo slice 42/83.0]
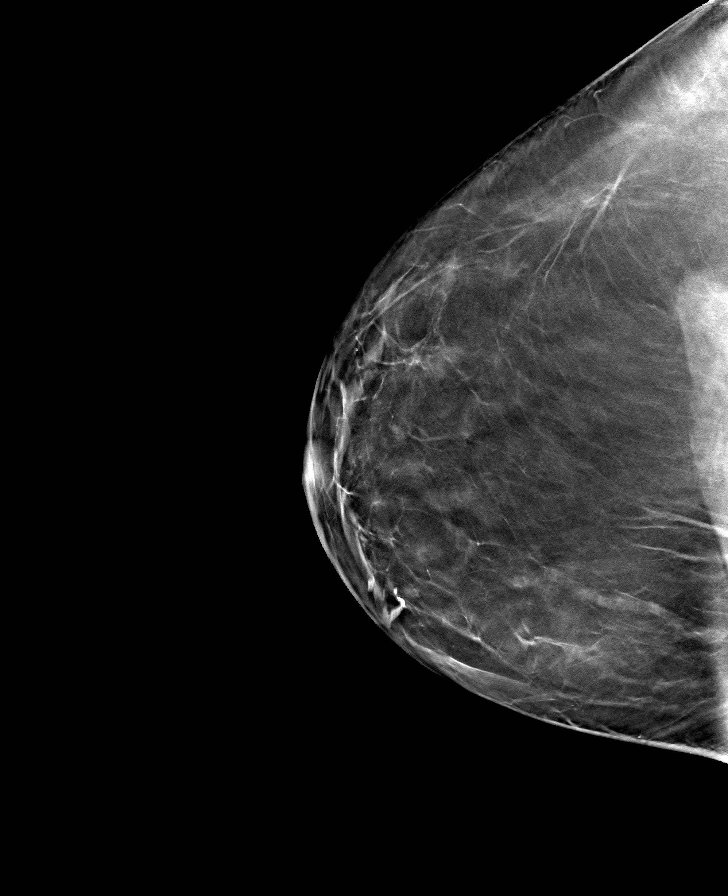

[8 of 24 positions shown; findings below may reference images not displayed]

FINDINGS: There are no findings suspicious for malignancy.
IMPRESSION: No mammographic evidence of malignancy. A result letter of this
screening mammogram will be mailed directly to the patient.

RECOMMENDATION:
Screening mammogram in one year. (Code:[8U])

BI-RADS CATEGORY  1: Negative.

## 2021-05-22 ENCOUNTER — Other Ambulatory Visit: Payer: Medicare HMO

## 2021-05-23 ENCOUNTER — Other Ambulatory Visit: Payer: Medicare HMO

## 2021-05-29 ENCOUNTER — Ambulatory Visit: Payer: Medicare HMO

## 2021-05-29 ENCOUNTER — Other Ambulatory Visit: Payer: Self-pay

## 2021-05-29 DIAGNOSIS — E7800 Pure hypercholesterolemia, unspecified: Secondary | ICD-10-CM

## 2021-05-29 DIAGNOSIS — M542 Cervicalgia: Secondary | ICD-10-CM

## 2021-05-29 DIAGNOSIS — I10 Essential (primary) hypertension: Secondary | ICD-10-CM

## 2021-05-29 DIAGNOSIS — I6523 Occlusion and stenosis of bilateral carotid arteries: Secondary | ICD-10-CM

## 2021-06-02 ENCOUNTER — Telehealth: Payer: Self-pay | Admitting: Pulmonary Disease

## 2021-06-02 NOTE — Telephone Encounter (Signed)
DME: [facility]  Called patient and she states she needs a new CPAP machine and that she has had her current one for 5-6 years now. She says its loud and make funny noises. I told her that I would have to get approval from Dr Elsworth Soho for a new CPAP order and then we can place the order for her. And that someone will get back in touch with her.  Dr Elsworth Soho please advise

## 2021-06-07 ENCOUNTER — Encounter: Payer: Self-pay | Admitting: Cardiology

## 2021-06-07 ENCOUNTER — Other Ambulatory Visit: Payer: Self-pay

## 2021-06-07 ENCOUNTER — Ambulatory Visit: Payer: Medicare HMO | Admitting: Cardiology

## 2021-06-07 VITALS — BP 113/71 | HR 66 | Temp 97.2°F | Resp 16 | Ht 63.0 in | Wt 174.8 lb

## 2021-06-07 DIAGNOSIS — I10 Essential (primary) hypertension: Secondary | ICD-10-CM

## 2021-06-07 DIAGNOSIS — Q2112 Patent foramen ovale: Secondary | ICD-10-CM

## 2021-06-07 DIAGNOSIS — I6523 Occlusion and stenosis of bilateral carotid arteries: Secondary | ICD-10-CM

## 2021-06-07 DIAGNOSIS — E7800 Pure hypercholesterolemia, unspecified: Secondary | ICD-10-CM

## 2021-06-07 NOTE — Progress Notes (Signed)
Primary Physician/Referring:  Aletha Halim., PA-C  Patient ID: Bridget Phelps, female    DOB: 05-29-1950, 71 y.o.   MRN: [id number]  Chief Complaint  Patient presents with   Hypertension   Neck Pain   Follow-up    6 weeks   HPI:    Bridget Phelps  is a 71 y.o. Caucasian female patient with hypertension, hyperlipidemia, premature coronary disease with sister having died at the age of 17 with massive myocardial infarction, small PFO by TEE in 2016, recurrent occipital headache, OSA presents for reestablishing cardiac care, I had last seen her in 2017.  I had seen her 6 weeks ago for evaluation of exertional neck pain and cardiac risk stratification.  Patient symptoms have been ongoing for the past 6 to 8 months, no change in symptoms, husband present at the bedside. Past Medical History:  Diagnosis Date   GERD (gastroesophageal reflux disease)    Hypercholesterolemia    no Rx meds   Hypertension    controlled on lisinopril/hctz   Sleep apnea    does have cpap   Tubular adenoma of colon 07/2011   Past Surgical History:  Procedure Laterality Date   ABDOMINAL HYSTERECTOMY  1991   BUNIONECTOMY Bilateral 2012   TEE WITHOUT CARDIOVERSION N/A 03/28/2015   Procedure: TRANSESOPHAGEAL ECHOCARDIOGRAM (TEE);  Surgeon: [name], MD;  Location: [facility] ENDOSCOPY;  Service: Cardiovascular;  Laterality: N/A;   [place]   Family History  Problem Relation Age of Onset   Stroke Mother    Diabetes Mother    CAD Father    Stroke Father    Lung cancer Father    Diabetes Sister    Heart attack Sister    Colon cancer Maternal Aunt 70   Diabetes Maternal Grandmother    Colon cancer Maternal Grandmother        greatgrand   Breast cancer Maternal Grandmother    Stroke Maternal Grandfather    Diabetes Paternal Grandmother    Esophageal cancer Neg Hx    Stomach cancer Neg Hx    Rectal cancer Neg Hx     Social History   Tobacco Use   Smoking status: Never   Smokeless  tobacco: Never  Substance Use Topics   Alcohol use: Yes    Alcohol/week: 7.0 standard drinks    Types: 7 Glasses of wine per week    Comment: Occasionally   Marital Status: Married  ROS  Review of Systems  Cardiovascular:  Negative for chest pain, dyspnea on exertion and leg swelling.  Gastrointestinal:  Negative for melena.  Objective  Blood pressure 113/71, pulse 66, temperature (!) 97.2 F (36.2 C), temperature source Temporal, resp. rate 16, height 5\' 3"  (1.6 m), weight 174 lb 12.8 oz (79.3 kg), SpO2 96 %. Body mass index is 30.96 kg/m.  Vitals with BMI 06/07/2021 04/26/2021 08/03/2020  Height 5\' 3"  5\' 3"  5\' 3"   Weight 174 lbs 13 oz 172 lbs 10 oz 176 lbs  BMI 30.97 70.01 74.94  Systolic 496 759 163  Diastolic 71 72 78  Pulse 66 58 66    Physical Exam Neck:     Vascular: No carotid bruit or JVD.  Cardiovascular:     Rate and Rhythm: Normal rate and regular rhythm.     Pulses: Intact distal pulses.     Heart sounds: Normal heart sounds. No murmur heard.   No gallop.  Pulmonary:     Effort: Pulmonary effort is normal.  Breath sounds: Normal breath sounds.  Abdominal:     General: Bowel sounds are normal.     Palpations: Abdomen is soft.  Musculoskeletal:        General: No swelling.     Laboratory examination:   External labs:   Labs 03/30/2021:  Potassium 3.5, BUN 15, creatinine 0.57, CMP otherwise normal.  A1c 6.5%.  TSH normal.  Hb 13.3/HCT 38.0, platelets 242.  Total cholesterol 278, triglycerides 169, HDL 47, LDL direct 204. Medications and allergies   Allergies  Allergen Reactions   Sulfa Antibiotics Rash   Medication list after today's encounter    Current Outpatient Medications:    Bee Pollen 1000 MG TABS, Take by mouth., Disp: , Rfl:    brimonidine-timolol (COMBIGAN) 0.2-0.5 % ophthalmic solution, Place 1 drop into both eyes every 12 (twelve) hours., Disp: , Rfl:    ezetimibe (ZETIA) 10 MG tablet, Take 1 tablet (10 mg total) by mouth daily  after supper., Disp: 90 tablet, Rfl: 0   losartan-hydrochlorothiazide (HYZAAR) 50-12.5 MG tablet, Take 1 tablet by mouth daily., Disp: , Rfl:    Multiple Vitamins-Minerals (WOMENS MULTIVITAMIN + COLLAGEN PO), Take 1 capsule by mouth daily. MULTI COLLAGEN PROTEIN, Disp: , Rfl:    psyllium (METAMUCIL SMOOTH TEXTURE) 28 % packet, Take 1 packet by mouth daily., Disp: , Rfl:    rosuvastatin (CRESTOR) 20 MG tablet, Take 1 tablet (20 mg total) by mouth daily., Disp: 90 tablet, Rfl: 0  Radiology:   MRI of the brain and MR angio of the head and neck 04/21/2021: The common carotid and internal carotid arteries are patent within the neck without stenosis. The vertebral arteries are patent within the neck. Apparent mild stenosis at the origin of the non-dominant right vertebral artery.  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  X-Ray C Spine 03/30/2021: 1. No acute fracture or dislocation. No listhesis.  2. Moderate to severe multilevel chronic cervical spondylosis with moderate to high-grade intervertebral disc height loss at C3-4, C4-5 and C5-6 with additional multilevel disc height narrowing. Bulky ventral endplate spurring from C3 through C6.  3. No prevertebral soft tissue edema.  Cardiac Studies:   Exercise myoview stress 02/25/2015: 1. The resting electrocardiogram demonstrated normal sinus rhythm, normal resting conduction and no resting arrhythmias.  The stress electrocardiogram was normal.  The patient performed treadmill exercise using a Bruce protocol, completing 7:44 minutes. The patient completed an estimated workload of 9.74 METS, 95% of the maximum predicted heart rate. The stress test was terminated because of fatigue. 2. Myocardial perfusion imaging is normal. Overall left ventricular systolic function was normal without regional  wall motion abnormalities. The left ventricular ejection fraction was 74%.  Carotid artery duplex 07/20/2015: Color duplex indicates minimal heterogeneous plaque, with no hemodynamically significant stenosis by duplex criteria in the extracranial cerebrovascular circulation.  TEE 03/28/2015: - Left ventricle: Systolic function was normal. Wall motion was   normal; there were no regional wall motion abnormalities.  - Aorta: There was mild, non-ulcerated atheromatous plaque in the  aortic arch, extending to the proximal descending aorta.  - Left atrium: No evidence of thrombus in the atrial cavity or   appendage.  - Right atrium: No evidence of thrombus in the atrial cavity or   appendage.  - Atrial septum: There was a very small patent foramen ovale.   Agitated saline contrast study showed a trivial right-to-left  shunt, following an increase in RA pressure induced by the   Valsalva maneuver.  PCV ECHOCARDIOGRAM COMPLETE 05/29/2021  Left ventricle cavity is normal in size. Mild basal septal hypertrophy. Normal global wall motion. Normal LV systolic function with EF 63%. Doppler evidence of grade I (impaired) diastolic dysfunction, normal LAP. Thin interatrial septum without definite evidence of PFO/ASD. Unlike previous study in 2016, PFO, right sided dilatation not appreciated on this study. Consider TEE, if clinically indicated.  PCV MYOCARDIAL PERFUSION WITH LEXISCAN 05/29/2021  Narrative Lexiscan (with Mod Bruce protocol) Nuclear stress test 05/29/2021: Nondiagnostic ECG stress.  The heart rate response was consistent with Lexiscan. Myocardial perfusion is normal. Overall LV systolic function is normal without regional wall motion abnormalities. Stress LV EF: 80%. No previous exam available for comparison. Low risk.       EKG:   EKG 04/26/2021: Normal sinus rhythm at rate of 52 bpm, normal axis, incomplete right bundle branch block.  No acute ischemia, normal EKG.    Assessment      ICD-10-CM   1. Essential hypertension  I10     2. Pure hypercholesterolemia  E78.00     3. PFO (patent foramen ovale)  Q21.12     4. Atherosclerosis of both carotid arteries  I65.23       There are no discontinued medications.   No orders of the defined types were placed in this encounter.  No orders of the defined types were placed in this encounter.  Recommendations:   Bridget Phelps is a 71 y.o. Caucasian female patient with hypertension, hyperlipidemia, premature coronary disease with sister having died at the age of 39 with massive myocardial infarction, small PFO by TEE in 2016, recurrent occipital headache, OSA I had last seen her in 2017.  She is referred back to me by Bing Matter, PA-C for evaluation of exertional neck pain and cardiac risk stratification.  I have seen 6 weeks ago.  I not suspected angina pectoris, I reviewed the results of echocardiogram and it is very reassuring to know that the right ventricle is not dilated and essentially normal echocardiogram.  Nuclear stress test is also low risk.  No evidence of ischemia.  She has now been referred for repeat MRI of her neck, I suspect her neck pain and arm pain are related to musculoskeletal etiology.  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  I did not increase the dose of Crestor to 20 mg and added Zetia 10 mg daily which she is tolerating.  She has appointment scheduled with her PCP along with lab draw and I will request Bing Matter to follow-up on the labs and for further future prescription refills.  Goal LDL at least <100.  I will see her back on a as needed basis.     [name], MD, [facility] 06/07/2021, 10:24 AM Office: [phone]

## 2021-06-14 ENCOUNTER — Encounter: Payer: Self-pay | Admitting: Adult Health

## 2021-06-14 ENCOUNTER — Other Ambulatory Visit: Payer: Self-pay

## 2021-06-14 ENCOUNTER — Ambulatory Visit: Payer: Medicare HMO | Admitting: Adult Health

## 2021-06-14 VITALS — BP 118/72 | HR 59 | Temp 97.8°F | Ht 63.0 in | Wt 174.6 lb

## 2021-06-14 DIAGNOSIS — G4733 Obstructive sleep apnea (adult) (pediatric): Secondary | ICD-10-CM

## 2021-06-14 NOTE — Progress Notes (Signed)
? ?@Patient  ID: Bridget Phelps, female    DOB: [birth year], 71 y.o.   MRN: [id number] ? ?Chief Complaint  ?Patient presents with  ? Follow-up  ? ? ?Referring provider: ?Aletha Halim., PA-C ? ?HPI: ?71 year old female followed for obstructive sleep apnea ? ?TEST/EVENTS :  ? ?HST 04/2013:  AHI 14/hr.  ? ?06/14/21 : Follow up : OSA  ?Patient presents for 1 year follow-up.  Patient has underlying mild to moderate obstructive sleep apnea.  She is on nocturnal CPAP.  Patient says that she had been doing very well on her CPAP.  However her machine stopped working about 2 weeks ago it started making some funny noises and then all of a sudden it stopped working.  Patient says she needs to get restarted back on this because she she feels so much better when she wears her CPAP.  CPAP download showed excellent compliance with daily average usage at 6 hours.  Patient is on CPAP 12 cm H2O.  Daily average usage at 6 hours.  AHI 0.2.  Patient wears a fullface mask.  Uses [facility] ?Patient feels that she benefits from CPAP.  Has decreased daytime sleepiness and improved sleep hygiene when she wears her CPAP ?CPAP machines about 72 years old. ? ?Allergies  ?Allergen Reactions  ? Sulfa Antibiotics Rash  ? ? ?Immunization History  ?Administered Date(s) Administered  ? Influenza Split 12/15/2012, 01/26/2013, 01/15/2015, 01/22/2017, 01/29/2020  ? Influenza, High Dose Seasonal PF 01/22/2018, 01/12/2019  ? Influenza,inj,Quad PF,6+ Mos 01/26/2013  ? Influenza-Unspecified 01/26/2013, 02/07/2021  ? Moderna Sars-Covid-2 Vaccination 05/25/2019, 06/21/2019, 02/11/2020, 12/29/2020  ? PFIZER Comirnaty(Gray Top)Covid-19 Tri-Sucrose Vaccine 09/08/2020  ? Pneumococcal Conjugate-13 07/20/2015  ? Pneumococcal Polysaccharide-23 03/05/2018  ? Zoster, Live 01/31/2010  ? ? ?Past Medical History:  ?Diagnosis Date  ? GERD (gastroesophageal reflux disease)   ? Hypercholesterolemia   ? no Rx meds  ? Hypertension   ? controlled on lisinopril/hctz  ? Sleep apnea    ? does have cpap  ? Tubular adenoma of colon 07/2011  ? ? ?Tobacco History: ?Social History  ? ?Tobacco Use  ?Smoking Status Never  ?Smokeless Tobacco Never  ? ?Counseling given: Not Answered ? ? ?Outpatient Medications Prior to Visit  ?Medication Sig Dispense Refill  ? Bee Pollen 1000 MG TABS Take by mouth.    ? brimonidine-timolol (COMBIGAN) 0.2-0.5 % ophthalmic solution Place 1 drop into both eyes every 12 (twelve) hours.    ? ezetimibe (ZETIA) 10 MG tablet Take 1 tablet (10 mg total) by mouth daily after supper. 90 tablet 0  ? losartan-hydrochlorothiazide (HYZAAR) 50-12.5 MG tablet Take 1 tablet by mouth daily.    ? Multiple Vitamins-Minerals (WOMENS MULTIVITAMIN + COLLAGEN PO) Take 1 capsule by mouth daily. MULTI COLLAGEN PROTEIN    ? psyllium (METAMUCIL SMOOTH TEXTURE) 28 % packet Take 1 packet by mouth daily.    ? rosuvastatin (CRESTOR) 20 MG tablet Take 1 tablet (20 mg total) by mouth daily. 90 tablet 0  ? ?No facility-administered medications prior to visit.  ? ? ? ?Review of Systems:  ? ?Constitutional:   No  weight loss, night sweats,  Fevers, chills, fatigue, or  lassitude. ? ?HEENT:   No headaches,  Difficulty swallowing,  Tooth/dental problems, or  Sore throat,  ?              No sneezing, itching, ear ache, nasal congestion, post nasal drip,  ? ?CV:  No chest pain,  Orthopnea, PND, swelling in lower extremities, anasarca, dizziness, palpitations,  syncope.  ? ?GI  No heartburn, indigestion, abdominal pain, nausea, vomiting, diarrhea, change in bowel habits, loss of appetite, bloody stools.  ? ?Resp: No shortness of breath with exertion or at rest.  No excess mucus, no productive cough,  No non-productive cough,  No coughing up of blood.  No change in color of mucus.  No wheezing.  No chest wall deformity ? ?Skin: no rash or lesions. ? ?GU: no dysuria, change in color of urine, no urgency or frequency.  No flank pain, no hematuria  ? ?MS:  No joint pain or swelling.  No decreased range of motion.  No  back pain. ? ? ? ?Physical Exam ? ?BP 118/72 (BP Location: Left Arm, Patient Position: Sitting, Cuff Size: Normal)   Pulse (!) 59   Temp 97.8 ?F (36.6 ?C) (Oral)   Ht 5\' 3"  (1.6 m)   Wt 174 lb 9.6 oz (79.2 kg)   SpO2 98%   BMI 30.93 kg/m?  ? ?GEN: A/Ox3; pleasant , NAD, well nourished  ?  ?HEENT:  [place]/AT,   NOSE-clear, THROAT-clear, no lesions, no postnasal drip or exudate noted.  ? ?NECK:  Supple w/ fair ROM; no JVD; normal carotid impulses w/o bruits; no thyromegaly or nodules palpated; no lymphadenopathy.   ? ?RESP  Clear  P & A; w/o, wheezes/ rales/ or rhonchi. no accessory muscle use, no dullness to percussion ? ?CARD:  RRR, no m/r/g, no peripheral edema, pulses intact, no cyanosis or clubbing. ? ?GI:   Soft & nt; nml bowel sounds; no organomegaly or masses detected.  ? ?Musco: Warm bil, no deformities or joint swelling noted.  ? ?Neuro: alert, no focal deficits noted.   ? ?Skin: Warm, no lesions or rashes ? ? ? ?Lab Results: ? ?CBC ?No results found for: WBC, RBC, HGB, HCT, PLT, MCV, MCH, MCHC, RDW, LYMPHSABS, MONOABS, EOSABS, BASOSABS ? ?BMET ?No results found for: NA, K, CL, CO2, GLUCOSE, BUN, CREATININE, CALCIUM, GFRNONAA, GFRAA ? ?BNP ?No results found for: BNP ? ?ProBNP ?No results found for: PROBNP ? ?Imaging: ? ? ? ? ?No flowsheet data found. ? ?No results found for: NITRICOXIDE ? ? ? ? ? ?Assessment & Plan:  ? ?No problem-specific Assessment & Plan notes found for this encounter. ? ? ? ? ?Rexene Edison, NP ?06/14/2021 ? ?

## 2021-06-14 NOTE — Patient Instructions (Addendum)
Order for new CPAP machine  ?Continue same setting .  ?Work on healthy weight loss.  ?Follow up in 3 months with Dr. Elsworth Soho  or Adaja Wander NP   ?

## 2021-06-15 NOTE — Assessment & Plan Note (Signed)
Continue to work on healthy weight loss 

## 2021-06-15 NOTE — Assessment & Plan Note (Addendum)
Mild to moderate obstructive sleep apnea.  Patient's machine has stopped working.  She will need a new machine.  Compliance download shows good compliance.  With excellent control. ?Order for new CPAP with same settings.  Continue at 12 cm H2O. ? ?- discussed how weight can impact sleep and risk for sleep disordered breathing ?- discussed options to assist with weight loss: combination of diet modification, cardiovascular and strength training exercises ?  ?- had an extensive discussion regarding the adverse health consequences related to untreated sleep disordered breathing ?- specifically discussed the risks for hypertension, coronary artery disease, cardiac dysrhythmias, cerebrovascular disease, and diabetes ?- lifestyle modification discussed ?  ?- discussed how sleep disruption can increase risk of accidents, particularly when driving ?- safe driving practices were discussed ?  ? ? ?Plan  ?Patient Instructions  ?Order for new CPAP machine  ?Continue same setting .  ?Work on healthy weight loss.  ?Follow up in 3 months with Dr. Elsworth Soho  or Marce Charlesworth NP   ?  ? ?

## 2021-06-28 NOTE — Telephone Encounter (Signed)
Patient states [facility] needs more information from [place] for CPAP machine order. Patient phone number is [phone]. ?

## 2021-06-28 NOTE — Telephone Encounter (Signed)
Spoke with [facility] who is needing OV note form last visit which occurred on 06/14/21 where C-Pap was reordered. OV note was faxed to [facility] via Epic routing. Nothing further needed at this time.  ?

## 2021-06-30 ENCOUNTER — Telehealth: Payer: Self-pay | Admitting: Adult Health

## 2021-06-30 NOTE — Telephone Encounter (Signed)
Bridget Phelps from [facility] needs office notes stating need for replacement CPAP machine.Why a CPAP machine is being replaced.  Bridget Phelps phone number is [phone]. Fax number is [phone].  ?

## 2021-06-30 NOTE — Telephone Encounter (Signed)
Called and spoke with Lyndee Leo at [facility] to let her know that I was facing over [place] notes from 06/14/21. She expressed understanding. Nothing further needed at this time.  ?

## 2021-07-03 ENCOUNTER — Telehealth: Payer: Self-pay | Admitting: Adult Health

## 2021-07-03 NOTE — Telephone Encounter (Signed)
Called and spoke with Bridget Phelps at [facility]. She stated that they did receive the order but have not received the office notes that were faxed on 06/30/21. She confirmed the fax number that was used on 06/30/21. Will go ahead and re-fax the OV notes to [phone].  ? ?Called and spoke with patient's husband. He is aware that I will re-fax the OV notes today. He had the same fax number that we have on file.  ? ?Will keep this encounter open for follow up.  ? ? ?

## 2021-07-07 ENCOUNTER — Telehealth: Payer: Self-pay | Admitting: Adult Health

## 2021-07-07 NOTE — Telephone Encounter (Signed)
Faxed last OV note to [facility] which stated need for new C-Pap via [place] fax. Nothing further needed at this time.  ?

## 2021-07-13 NOTE — Telephone Encounter (Signed)
Bridget Phelps with [facility] needs office notes and sleep study for CPAP machine. Bridget Phelps phone number is [phone]. Fax number is [phone]. ?

## 2021-07-13 NOTE — Telephone Encounter (Signed)
Spoke with Lyndee Leo, she states needing sleep study and ov note prior to study  ?I have faxed the sleep study and ov with [place] from 2014 prior to study being done  ?Nothing further needed ?

## 2021-07-18 ENCOUNTER — Other Ambulatory Visit: Payer: Self-pay | Admitting: Cardiology

## 2021-07-18 DIAGNOSIS — E7800 Pure hypercholesterolemia, unspecified: Secondary | ICD-10-CM

## 2021-08-03 ENCOUNTER — Ambulatory Visit: Payer: Medicare HMO | Admitting: Adult Health

## 2021-09-14 ENCOUNTER — Ambulatory Visit: Payer: Medicare HMO | Admitting: Adult Health

## 2021-10-08 ENCOUNTER — Other Ambulatory Visit: Payer: Self-pay | Admitting: Cardiology

## 2021-10-08 DIAGNOSIS — E7800 Pure hypercholesterolemia, unspecified: Secondary | ICD-10-CM

## 2021-11-15 ENCOUNTER — Ambulatory Visit: Payer: Medicare HMO | Admitting: Adult Health

## 2021-11-17 ENCOUNTER — Encounter: Payer: Self-pay | Admitting: Adult Health

## 2021-11-17 ENCOUNTER — Ambulatory Visit: Payer: Medicare HMO | Admitting: Adult Health

## 2021-11-17 DIAGNOSIS — G4733 Obstructive sleep apnea (adult) (pediatric): Secondary | ICD-10-CM

## 2021-11-17 NOTE — Patient Instructions (Addendum)
Continue on CPAP At bedtime   Keep up good work  Can try CPAP liner.  Work on healthy weight loss.  Do not drive if sleepy .  Follow up in 1 year with Dr. Elsworth Soho  or Claron Rosencrans NP

## 2021-11-17 NOTE — Assessment & Plan Note (Signed)
Excellent control and compliance on CPAP.  Continue on current settings  Plan  Patient Instructions  Continue on CPAP At bedtime   Keep up good work  Can try CPAP liner.  Work on healthy weight loss.  Do not drive if sleepy .  Follow up in 1 year with Dr. Elsworth Soho  or Aurielle Slingerland NP

## 2021-11-17 NOTE — Progress Notes (Signed)
$'@Patient't$  ID: Bridget Phelps, female    DOB: December 24, 1950, 71 y.o.   MRN: [id number]  Chief Complaint  Patient presents with   Follow-up    Referring provider: Aletha Halim., PA-C  HPI: 71 year old female followed for obstructive sleep apnea  TEST/EVENTS :  HST 04/2013:  AHI 14/hr.   11/17/2021 Follow up : OSA  Patient presents for a follow-up visit.  Patient is recently got a new CPAP machine.  She has underlying sleep apnea.  Says she uses her CPAP every single night cannot sleep without it.  Her machine was old and recently got her new machine.  Patient says it is working very well.  Feels that she benefits from CPAP with decreased daytime sleepiness.  CPAP download shows excellent compliance with 100% usage.  Daily average usage at 7 hours.  Patient is on CPAP at 12 cm H2O.  AHI 0.9/hour. She uses a fullface mask.  Says that occasionally she gets some facial redness.  We did talk about using CPAP liners to see if this helped with skin irritation.   Allergies  Allergen Reactions   Sulfa Antibiotics Rash    Immunization History  Administered Date(s) Administered   Influenza Split 12/15/2012, 01/26/2013, 01/15/2015, 01/22/2017, 01/29/2020   Influenza, High Dose Seasonal PF 01/22/2018, 01/12/2019   Influenza,inj,Quad PF,6+ Mos 01/26/2013   Influenza-Unspecified 01/26/2013, 01/15/2015, 02/07/2021   Moderna Sars-Covid-2 Vaccination 05/25/2019, 06/21/2019, 02/11/2020, 12/29/2020   PFIZER Comirnaty(Gray Top)Covid-19 Tri-Sucrose Vaccine 09/08/2020   Pneumococcal Conjugate-13 07/20/2015   Pneumococcal Polysaccharide-23 03/05/2018   Zoster, Live 01/31/2010    Past Medical History:  Diagnosis Date   GERD (gastroesophageal reflux disease)    Hypercholesterolemia    no Rx meds   Hypertension    controlled on lisinopril/hctz   Sleep apnea    does have cpap   Tubular adenoma of colon 07/2011    Tobacco History: Social History   Tobacco Use  Smoking Status Never  Smokeless  Tobacco Never   Counseling given: Not Answered   Outpatient Medications Prior to Visit  Medication Sig Dispense Refill   Bee Pollen 1000 MG TABS Take by mouth.     brimonidine-timolol (COMBIGAN) 0.2-0.5 % ophthalmic solution Place 1 drop into both eyes every 12 (twelve) hours.     ezetimibe (ZETIA) 10 MG tablet TAKE 1 TABLET BY MOUTH ONCE DAILY AFTER SUPPER 90 tablet 0   losartan-hydrochlorothiazide (HYZAAR) 50-12.5 MG tablet Take 1 tablet by mouth daily.     Multiple Vitamins-Minerals (WOMENS MULTIVITAMIN + COLLAGEN PO) Take 1 capsule by mouth daily. MULTI COLLAGEN PROTEIN     psyllium (METAMUCIL SMOOTH TEXTURE) 28 % packet Take 1 packet by mouth daily.     rosuvastatin (CRESTOR) 20 MG tablet Take 1 tablet (20 mg total) by mouth daily. 90 tablet 0   No facility-administered medications prior to visit.     Review of Systems:   Constitutional:   No  weight loss, night sweats,  Fevers, chills, fatigue, or  lassitude.  HEENT:   No headaches,  Difficulty swallowing,  Tooth/dental problems, or  Sore throat,                No sneezing, itching, ear ache, nasal congestion, post nasal drip,   CV:  No chest pain,  Orthopnea, PND, swelling in lower extremities, anasarca, dizziness, palpitations, syncope.   GI  No heartburn, indigestion, abdominal pain, nausea, vomiting, diarrhea, change in bowel habits, loss of appetite, bloody stools.   Resp: No shortness of breath with exertion  or at rest.  No excess mucus, no productive cough,  No non-productive cough,  No coughing up of blood.  No change in color of mucus.  No wheezing.  No chest wall deformity  Skin: no rash or lesions.  No irritated skin on the face noted.  GU: no dysuria, change in color of urine, no urgency or frequency.  No flank pain, no hematuria   MS:  No joint pain or swelling.  No decreased range of motion.  No back pain.    Physical Exam  BP 112/70 (BP Location: Left Arm)   Pulse 60   Temp 98.3 F (36.8 C) (Oral)    Ht '5\' 3"'$  (1.6 m)   Wt 176 lb 3.2 oz (79.9 kg)   SpO2 96%   BMI 31.21 kg/m   GEN: A/Ox3; pleasant , NAD, well nourished    HEENT:  [place]/AT,   NOSE-clear, THROAT-clear, no lesions, no postnasal drip or exudate noted.   NECK:  Supple w/ fair ROM; no JVD; normal carotid impulses w/o bruits; no thyromegaly or nodules palpated; no lymphadenopathy.    RESP  Clear  P & A; w/o, wheezes/ rales/ or rhonchi. no accessory muscle use, no dullness to percussion  CARD:  RRR, no m/r/g, no peripheral edema, pulses intact, no cyanosis or clubbing.  GI:   Soft & nt; nml bowel sounds; no organomegaly or masses detected.   Musco: Warm bil, no deformities or joint swelling noted.   Neuro: alert, no focal deficits noted.    Skin: Warm, no lesions or rashes    Lab Results:  CBC No results found for: "WBC", "RBC", "HGB", "HCT", "PLT", "MCV", "MCH", "MCHC", "RDW", "LYMPHSABS", "MONOABS", "EOSABS", "BASOSABS"  BMET No results found for: "NA", "K", "CL", "CO2", "GLUCOSE", "BUN", "CREATININE", "CALCIUM", "GFRNONAA", "GFRAA"  BNP No results found for: "BNP"  ProBNP No results found for: "PROBNP"  Imaging: No results found.        No data to display          No results found for: "NITRICOXIDE"      Assessment & Plan:   OSA (obstructive sleep apnea) Excellent control and compliance on CPAP.  Continue on current settings  Plan  Patient Instructions  Continue on CPAP At bedtime   Keep up good work  Can try CPAP liner.  Work on healthy weight loss.  Do not drive if sleepy .  Follow up in 1 year with Dr. Elsworth Soho  or Aeron Donaghey NP         Rexene Edison, NP 11/17/2021

## 2021-11-30 ENCOUNTER — Encounter: Payer: Self-pay | Admitting: Gastroenterology

## 2021-12-11 ENCOUNTER — Encounter: Payer: Self-pay | Admitting: Gastroenterology

## 2021-12-27 ENCOUNTER — Ambulatory Visit (AMBULATORY_SURGERY_CENTER): Payer: Self-pay

## 2021-12-27 VITALS — Ht 63.0 in | Wt 175.0 lb

## 2021-12-27 DIAGNOSIS — Z86010 Personal history of colonic polyps: Secondary | ICD-10-CM

## 2021-12-27 MED ORDER — NA SULFATE-K SULFATE-MG SULF 17.5-3.13-1.6 GM/177ML PO SOLN: 1.0000 | ORAL | 0 refills | Status: DC

## 2021-12-27 NOTE — Progress Notes (Signed)
No egg or soy allergy known to patient  No issues known to pt with past sedation with any surgeries or procedures Patient denies ever being told they had issues or difficulty with intubation  No FH of Malignant Hyperthermia Pt is not on diet pills Pt is not on  home 02  Pt is not on blood thinners  Pt denies issues with constipation  No A fib or A flutter Have any cardiac testing pending--denied Pt instructed to use Single[employer] or GoodRx for a price reduction on prep   

## 2022-01-01 ENCOUNTER — Other Ambulatory Visit: Payer: Self-pay | Admitting: Cardiology

## 2022-01-01 DIAGNOSIS — E7800 Pure hypercholesterolemia, unspecified: Secondary | ICD-10-CM

## 2022-01-09 ENCOUNTER — Encounter: Payer: Self-pay | Admitting: Gastroenterology

## 2022-01-23 ENCOUNTER — Ambulatory Visit (AMBULATORY_SURGERY_CENTER): Payer: Medicare HMO | Admitting: Gastroenterology

## 2022-01-23 ENCOUNTER — Encounter: Payer: Self-pay | Admitting: Gastroenterology

## 2022-01-23 VITALS — BP 111/59 | HR 57 | Temp 96.0°F | Resp 16 | Ht 63.0 in | Wt 175.0 lb

## 2022-01-23 DIAGNOSIS — Z86010 Personal history of colonic polyps: Secondary | ICD-10-CM | POA: Diagnosis not present

## 2022-01-23 DIAGNOSIS — Z09 Encounter for follow-up examination after completed treatment for conditions other than malignant neoplasm: Secondary | ICD-10-CM

## 2022-01-23 DIAGNOSIS — Z800 Family history of malignant neoplasm of digestive organs: Secondary | ICD-10-CM

## 2022-01-23 DIAGNOSIS — D123 Benign neoplasm of transverse colon: Secondary | ICD-10-CM

## 2022-01-23 DIAGNOSIS — K635 Polyp of colon: Secondary | ICD-10-CM

## 2022-01-23 DIAGNOSIS — D125 Benign neoplasm of sigmoid colon: Secondary | ICD-10-CM

## 2022-01-23 MED ORDER — SODIUM CHLORIDE 0.9 % IV SOLN: 500.0000 mL | Freq: Once | INTRAVENOUS | Status: DC

## 2022-01-23 NOTE — Progress Notes (Signed)
To pacu, VSS. Report to [email or web address] 

## 2022-01-23 NOTE — Progress Notes (Signed)
Called to room to assist during endoscopic procedure.  Patient ID and intended procedure confirmed with present staff. Received instructions for my participation in the procedure from the performing physician.  

## 2022-01-23 NOTE — Patient Instructions (Signed)
HANDOUTS PROVIDED ON: HIGH FIBER DIET, POLYPS, DIVERTICULOSIS, & HEMORRHOIDS  The polyps removed today have been sent for pathology.  The results can take 1-3 weeks to receive.  When your next colonoscopy should occur will be based on the pathology results.    You may resume your previous diet and medication schedule.  Thank you for allowing us to care for you today!!!   YOU HAD AN ENDOSCOPIC PROCEDURE TODAY AT THE [facility]:   Refer to the procedure report that was given to you for any specific questions about what was found during the examination.  If the procedure report does not answer your questions, please call your gastroenterologist to clarify.  If you requested that your care partner not be given the details of your procedure findings, then the procedure report has been included in a sealed envelope for you to review at your convenience later.  YOU SHOULD EXPECT: Some feelings of bloating in the abdomen. Passage of more gas than usual.  Walking can help get rid of the air that was put into your GI tract during the procedure and reduce the bloating. If you had a lower endoscopy (such as a colonoscopy or flexible sigmoidoscopy) you may notice spotting of blood in your stool or on the toilet paper. If you underwent a bowel prep for your procedure, you may not have a normal bowel movement for a few days.  Please Note:  You might notice some irritation and congestion in your nose or some drainage.  This is from the oxygen used during your procedure.  There is no need for concern and it should clear up in a day or so.  SYMPTOMS TO REPORT IMMEDIATELY:  Following lower endoscopy (colonoscopy or flexible sigmoidoscopy):  Excessive amounts of blood in the stool  Significant tenderness or worsening of abdominal pains  Swelling of the abdomen that is new, acute  Fever of 100F or higher  For urgent or emergent issues, a gastroenterologist can be reached at any hour by calling [phone]. Do not use MyChart messaging for urgent concerns.    DIET:  We do recommend a small meal at first, but then you may proceed to your regular diet.  Drink plenty of fluids but you should avoid alcoholic beverages for 24 hours.  ACTIVITY:  You should plan to take it easy for the rest of today and you should NOT DRIVE or use heavy machinery until tomorrow (because of the sedation medicines used during the test).    FOLLOW UP: Our staff will call the number listed on your records the next business day following your procedure.  We will call around 7:15- 8:00 am to check on you and address any questions or concerns that you may have regarding the information given to you following your procedure. If we do not reach you, we will leave a message.     If any biopsies were taken you will be contacted by phone or by letter within the next 1-3 weeks.  Please call us at [phone] if you have not heard about the biopsies in 3 weeks.    SIGNATURES/CONFIDENTIALITY: You and/or your care partner have signed paperwork which will be entered into your electronic medical record.  These signatures attest to the fact that that the information above on your After Visit Summary has been reviewed and is understood.  Full responsibility of the confidentiality of this discharge information lies with you and/or your care-partner.  

## 2022-01-23 NOTE — Progress Notes (Signed)
Pt's states no medical or surgical changes since previsit or office visit. 

## 2022-01-23 NOTE — Progress Notes (Signed)
History & Physical  Primary Care Physician:  Aletha Halim., PA-C Primary Gastroenterologist: Lucio Edward, MD  CHIEF COMPLAINT:  [facility], Personal history of colon polyps   HPI: Bridget Phelps is a 71 y.o. female with a family history of colon cancer, multiple second-degree relatives and personal history of adenomatous colon polyps for surveillance colonoscopy.     Past Medical History:  Diagnosis Date   GERD (gastroesophageal reflux disease)    Hypercholesterolemia    no Rx meds   Hypertension    controlled on lisinopril/hctz   Sleep apnea    does have cpap   Tubular adenoma of colon 07/2011    Past Surgical History:  Procedure Laterality Date   ABDOMINAL HYSTERECTOMY  04/16/1989   BUNIONECTOMY Bilateral 04/16/2010   CATARACT EXTRACTION Bilateral 2020   TEE WITHOUT CARDIOVERSION N/A 03/28/2015   Procedure: TRANSESOPHAGEAL ECHOCARDIOGRAM (TEE);  Surgeon: [name], MD;  Location: [facility] ENDOSCOPY;  Service: Cardiovascular;  Laterality: N/A;   WISDOM TOOTH EXTRACTION  04/16/1980    Prior to Admission medications   Medication Sig Start Date End Date Taking? Authorizing Provider  dorzolamide-timolol (COSOPT) 22.3-6.8 MG/ML ophthalmic solution 1 drop 2 (two) times daily. 12/20/21  Yes [provider]  ezetimibe (ZETIA) 10 MG tablet TAKE 1 TABLET BY MOUTH ONCE DAILY AFTER SUPPER 01/01/22  Yes [name], MD  losartan-hydrochlorothiazide (HYZAAR) 50-12.5 MG tablet Take 1 tablet by mouth daily.   Yes [provider]  psyllium (METAMUCIL SMOOTH TEXTURE) 28 % packet Take 1 packet by mouth daily.   Yes [provider]  rosuvastatin (CRESTOR) 20 MG tablet Take 1 tablet (20 mg total) by mouth daily. 04/26/21  Yes [name], MD  Bee Pollen 1000 MG TABS Take by mouth.    [provider]  Multiple Vitamins-Minerals (WOMENS MULTIVITAMIN + COLLAGEN PO) Take 1 capsule by mouth daily. [place] PROTEIN    [provider]    Current Outpatient  Medications  Medication Sig Dispense Refill   dorzolamide-timolol (COSOPT) 22.3-6.8 MG/ML ophthalmic solution 1 drop 2 (two) times daily.     ezetimibe (ZETIA) 10 MG tablet TAKE 1 TABLET BY MOUTH ONCE DAILY AFTER SUPPER 90 tablet 0   losartan-hydrochlorothiazide (HYZAAR) 50-12.5 MG tablet Take 1 tablet by mouth daily.     psyllium (METAMUCIL SMOOTH TEXTURE) 28 % packet Take 1 packet by mouth daily.     rosuvastatin (CRESTOR) 20 MG tablet Take 1 tablet (20 mg total) by mouth daily. 90 tablet 0   Bee Pollen 1000 MG TABS Take by mouth.     Multiple Vitamins-Minerals (WOMENS MULTIVITAMIN + COLLAGEN PO) Take 1 capsule by mouth daily. MULTI COLLAGEN PROTEIN     Current Facility-Administered Medications  Medication Dose Route Frequency Provider Last Rate Last Admin   0.9 %  sodium chloride infusion  500 mL Intravenous Once Ladene Artist, MD        Allergies as of 01/23/2022 - Review Complete 01/23/2022  Allergen Reaction Noted   Sulfa antibiotics Rash 07/18/2011    Family History  Problem Relation Age of Onset   Stroke Mother    Diabetes Mother    CAD Father    Stroke Father    Lung cancer Father    Diabetes Sister    Heart attack Sister    Colon cancer Maternal Aunt 2   Diabetes Maternal Grandmother    Colon cancer Maternal Grandmother        greatgrand   Breast cancer Maternal Grandmother    Stroke  Maternal Grandfather    Diabetes Paternal Grandmother    Esophageal cancer Neg Hx    Stomach cancer Neg Hx    Rectal cancer Neg Hx     Social History   Socioeconomic History   Marital status: Married    Spouse name: Not on file   Number of children: 4   Years of education: Not on file   Highest education level: Not on file  Occupational History   Occupation: retired  Tobacco Use   Smoking status: Never   Smokeless tobacco: Never  Vaping Use   Vaping Use: Never used  Substance and Sexual Activity   Alcohol use: Yes    Alcohol/week: 7.0 standard drinks of alcohol     Types: 7 Glasses of wine per week    Comment: Occasionally   Drug use: No   Sexual activity: Not on file  Other Topics Concern   Not on file  Social History Narrative   Denies caffeine use.   Social Determinants of Health   Financial Resource Strain: Not on file  Food Insecurity: Not on file  Transportation Needs: Not on file  Physical Activity: Not on file  Stress: Not on file  Social Connections: Not on file  Intimate Partner Violence: Not on file    Review of Systems:  All systems reviewed were negative except where noted in HPI.   Physical Exam: General:  Alert, well-developed, in NAD Head:  Normocephalic and atraumatic. Eyes:  Sclera clear, no icterus.   Conjunctiva pink. Ears:  Normal auditory acuity. Mouth:  No deformity or lesions.  Neck:  Supple; no masses . Lungs:  Clear throughout to auscultation.   No wheezes, crackles, or rhonchi. No acute distress. Heart:  Regular rate and rhythm; no murmurs. Abdomen:  Soft, nondistended, nontender. No masses, hepatomegaly. No obvious masses.  Normal bowel .    Rectal:  Deferred   Msk:  Symmetrical without gross deformities.. Pulses:  Normal pulses noted. Extremities:  Without edema. Neurologic:  Alert and  oriented x4;  grossly normal neurologically. Skin:  Intact without significant lesions or rashes. Cervical Nodes:  No significant cervical adenopathy. Psych:  Alert and cooperative. Normal mood and affect.  Impression / Plan:   Family history of colon cancer, multiple second-degree relatives and personal history of adenomatous colon polyps for surveillance colonoscopy.    Bridget Phelps. Fuller Plan  01/23/2022, 10:32 AM See Shea Evans, [facility], to contact our on call provider

## 2022-01-23 NOTE — Op Note (Signed)
[place] Patient Name: Bridget Phelps Procedure Date: 01/23/2022 10:32 AM MRN: [id number] Endoscopist: Ladene Artist , MD Age: 71 Referring MD:  Date of Birth: 1950/07/18 Gender: Female Account #: [id number] Procedure:                Colonoscopy Indications:              Surveillance: Personal history of adenomatous                            polyps on last colonoscopy 5 years ago, Family                            history of colon cancer, multiple 2nd-degree                            relatives Medicines:                Monitored Anesthesia Care Procedure:                Pre-Anesthesia Assessment:                           - Prior to the procedure, a History and Physical                            was performed, and patient medications and                            allergies were reviewed. The patient's tolerance of                            previous anesthesia was also reviewed. The risks                            and benefits of the procedure and the sedation                            options and risks were discussed with the patient.                            All questions were answered, and informed consent                            was obtained. Prior Anticoagulants: The patient has                            taken no previous anticoagulant or antiplatelet                            agents. ASA Grade Assessment: II - A patient with                            mild systemic disease. After reviewing the risks  and benefits, the patient was deemed in                            satisfactory condition to undergo the procedure.                           After obtaining informed consent, the colonoscope                            was passed under direct vision. Throughout the                            procedure, the patient's blood pressure, pulse, and                            oxygen saturations were monitored continuously. The                             CF [id number] #[id number] was introduced through the anus                            and advanced to the the cecum, identified by                            appendiceal orifice and ileocecal valve. The                            ileocecal valve, appendiceal orifice, and rectum                            were photographed. The quality of the bowel                            preparation was good. The colonoscopy was performed                            without difficulty. The patient tolerated the                            procedure well. Scope In: 10:41:08 AM Scope Out: 10:55:42 AM Scope Withdrawal Time: 0 hours 10 minutes 42 seconds  Total Procedure Duration: 0 hours 14 minutes 34 seconds  Findings:                 The perianal and digital rectal examinations were                            normal.                           Three sessile polyps were found in the sigmoid                            colon (2) and transverse colon (1). The polyps were  7 to 9 mm in size. These polyps were removed with a                            cold snare. Resection and retrieval were complete.                           Scattered medium-mouthed diverticula were found in                            the right colon. There was no evidence of                            diverticular bleeding.                           Multiple medium-mouthed diverticula were found in                            the left colon. There was evidence of diverticular                            spasm. Peri-diverticular erythema was seen. There                            was no evidence of diverticular bleeding.                           Internal hemorrhoids were found during                            retroflexion. The hemorrhoids were large and Grade                            I (internal hemorrhoids that do not prolapse).                           The exam was otherwise without abnormality on                             direct and retroflexion views. Complications:            No immediate complications. Estimated blood loss:                            None. Estimated Blood Loss:     Estimated blood loss: none. Impression:               - Three 7 to 9 mm polyps in the sigmoid colon and                            in the transverse colon, removed with a cold snare.                            Resected and retrieved.                           -  Mild diverticulosis in the right colon.                           - Moderate diverticulosis in the left colon.                           - Internal hemorrhoids.                           - The examination was otherwise normal on direct                            and retroflexion views. Recommendation:           - Repeat colonoscopy after studies are complete for                            surveillance based on pathology results.                           - Patient has a contact number available for                            emergencies. The signs and symptoms of potential                            delayed complications were discussed with the                            patient. Return to normal activities tomorrow.                            Written discharge instructions were provided to the                            patient.                           - High fiber diet.                           - Continue present medications.                           - Await pathology results.                           - Schedule hemorrhoid banding. Ladene Artist, MD 01/23/2022 11:00:17 AM This report has been signed electronically.

## 2022-01-24 ENCOUNTER — Telehealth: Payer: Self-pay

## 2022-01-24 NOTE — Telephone Encounter (Signed)
Scheduled hemorrhoid banding with patient for 02/14/22 at 3:20 pm.

## 2022-01-24 NOTE — Telephone Encounter (Signed)
  Follow up Call-     01/23/2022   10:15 AM  Call back number  Post procedure Call Back phone  # [phone]  Permission to leave phone message Yes     Patient questions:  Do you have a fever, pain , or abdominal swelling? No. Pain Score  0 *  Have you tolerated food without any problems? Yes.    Have you been able to return to your normal activities? Yes.    Do you have any questions about your discharge instructions: Diet   No. Medications  No. Follow up visit  No.  Do you have questions or concerns about your Care? No.  Actions: * If pain score is 4 or above: No action needed, pain <4.

## 2022-02-07 ENCOUNTER — Encounter: Payer: Self-pay | Admitting: Gastroenterology

## 2022-02-14 ENCOUNTER — Encounter: Payer: Self-pay | Admitting: Gastroenterology

## 2022-02-14 ENCOUNTER — Ambulatory Visit: Payer: Medicare HMO | Admitting: Gastroenterology

## 2022-02-14 VITALS — BP 124/84 | HR 76 | Ht 63.0 in | Wt 175.0 lb

## 2022-02-14 DIAGNOSIS — K640 First degree hemorrhoids: Secondary | ICD-10-CM | POA: Diagnosis not present

## 2022-02-14 NOTE — Progress Notes (Signed)
PROCEDURE NOTE: The patient presents with symptomatic large grade I hemorrhoids, requesting rubber band ligation of their hemorrhoidal disease.  All risks, benefits and alternative forms of therapy were described and informed consent was obtained.  The anorectum was pre-medicated during digital exam with 0.125% NTG and 5% lidocaine. In the Left Lateral Decubitus position anoscopic examination revealed large grade I hemorrhoids in the RA > RP > LL positions.   The decision was made to band the RA internal hemorrhoid, and the [place] was used to perform band ligation without complication.  Digital anorectal examination was then performed to assure proper positioning of the band and to adjust the banded tissue as required. No adjustment was needed.  No complications were encountered and the patient tolerated the procedure well.  Dietary and behavioral recommendations were given and along with follow-up instructions.   Return for possible additional hemorrhoid banding in 2 - 3 weeks as required.  High fiber diet, Benefiber daily and at least 8 glasses of water daily. The patient was discharged home without pain or other post procedure problems.

## 2022-02-14 NOTE — Patient Instructions (Addendum)
_______________________________________________________  If you are age 72 or older, your body mass index should be between 23-30. Your Body mass index is 31 kg/m. If this is out of the aforementioned range listed, please consider follow up with your Primary Care Provider.  If you are age 50 or younger, your body mass index should be between 19-25. Your Body mass index is 31 kg/m. If this is out of the aformentioned range listed, please consider follow up with your Primary Care Provider.   ________________________________________________________  The [facility] providers would like to encourage you to use [facility] to communicate with providers for non-urgent requests or questions.  Due to long hold times on the telephone, sending your provider a message by [facility] may be a faster and more efficient way to get a response.  Please allow 48 business hours for a response.  Please remember that this is for non-urgent requests.  _______________________________________________________  Dennis Bast have been scheduled for an appointment with Dr. Fuller Plan on 03-13-2022 at 3:40pm . Please arrive 15 minutes early for your appointment.  HEMORRHOID BANDING PROCEDURE    FOLLOW-UP CARE   The procedure you have had should have been relatively painless since the banding of the area involved does not have nerve endings and there is no pain sensation.  The rubber band cuts off the blood supply to the hemorrhoid and the band may fall off as soon as 48 hours after the banding (the band may occasionally be seen in the toilet bowl following a bowel movement). You may notice a temporary feeling of fullness in the rectum which should respond adequately to plain Tylenol or Motrin.  Following the banding, avoid strenuous exercise that evening and resume full activity the next day.  A sitz bath (soaking in a warm tub) or bidet is soothing, and can be useful for cleansing the area after bowel movements.     To avoid constipation,  take two tablespoons of natural wheat bran, natural oat bran, flax, Benefiber or any over the counter fiber supplement and increase your water intake to 7-8 glasses daily.    Unless you have been prescribed anorectal medication, do not put anything inside your rectum for two weeks: No suppositories, enemas, fingers, etc.  Occasionally, you may have more bleeding than usual after the banding procedure.  This is often from the untreated hemorrhoids rather than the treated one.  Don't be concerned if there is a tablespoon or so of blood.  If there is more blood than this, lie flat with your bottom higher than your head and apply an ice pack to the area. If the bleeding does not stop within a half an hour or if you feel faint, call our office at [phone] or go to the emergency room.  Problems are not common; however, if there is a substantial amount of bleeding, severe pain, chills, fever or difficulty passing urine (very rare) or other problems, you should call us at [phone] or report to the nearest emergency room.  Do not stay seated continuously for more than 2-3 hours for a day or two after the procedure.  Tighten your buttock muscles 10-15 times every two hours and take 10-15 deep breaths every 1-2 hours.  Do not spend more than a few minutes on the toilet if you cannot empty your bowel; instead re-visit the toilet at a later time.    It was a pleasure to see you today!  Thank you for trusting me with your gastrointestinal care!

## 2022-03-13 ENCOUNTER — Ambulatory Visit: Payer: Medicare HMO | Admitting: Gastroenterology

## 2022-03-13 ENCOUNTER — Encounter: Payer: Self-pay | Admitting: Gastroenterology

## 2022-03-13 VITALS — BP 120/84 | HR 65 | Ht 63.0 in | Wt 176.0 lb

## 2022-03-13 DIAGNOSIS — K640 First degree hemorrhoids: Secondary | ICD-10-CM

## 2022-03-13 NOTE — Patient Instructions (Signed)
HEMORRHOID BANDING PROCEDURE    FOLLOW-UP CARE   The procedure you have had should have been relatively painless since the banding of the area involved does not have nerve endings and there is no pain sensation.  The rubber band cuts off the blood supply to the hemorrhoid and the band may fall off as soon as 48 hours after the banding (the band may occasionally be seen in the toilet bowl following a bowel movement). You may notice a temporary feeling of fullness in the rectum which should respond adequately to plain Tylenol or Motrin.  Following the banding, avoid strenuous exercise that evening and resume full activity the next day.  A sitz bath (soaking in a warm tub) or bidet is soothing, and can be useful for cleansing the area after bowel movements.     To avoid constipation, take two tablespoons of natural wheat bran, natural oat bran, flax, Benefiber or any over the counter fiber supplement and increase your water intake to 7-8 glasses daily.    Unless you have been prescribed anorectal medication, do not put anything inside your rectum for two weeks: No suppositories, enemas, fingers, etc.  Occasionally, you may have more bleeding than usual after the banding procedure.  This is often from the untreated hemorrhoids rather than the treated one.  Don't be concerned if there is a tablespoon or so of blood.  If there is more blood than this, lie flat with your bottom higher than your head and apply an ice pack to the area. If the bleeding does not stop within a half an hour or if you feel faint, call our office at [phone] or go to the emergency room.  Problems are not common; however, if there is a substantial amount of bleeding, severe pain, chills, fever or difficulty passing urine (very rare) or other problems, you should call us at [phone] or report to the nearest emergency room.  Do not stay seated continuously for more than 2-3 hours for a day or two after the procedure.   Tighten your buttock muscles 10-15 times every two hours and take 10-15 deep breaths every 1-2 hours.  Do not spend more than a few minutes on the toilet if you cannot empty your bowel; instead re-visit the toilet at a later time.   Thank you for choosing me and [facility].  Malcolm T. Stark, Jr., MD., FACG  

## 2022-03-13 NOTE — Progress Notes (Signed)
PROCEDURE NOTE: The patient presents with symptomatic large grade I hemorrhoids, requesting rubber band ligation of their hemorrhoidal disease.  All risks, benefits and alternative forms of therapy were described and informed consent was obtained. RA hemorrhoid was banded on 11/1 with significant improvement in symptoms  The anorectum was pre-medicated during digital exam with 0.125% NTG and 5% lidocaine. External skin tags noted on DRE.   The decision was made to band the RP internal hemorrhoid, and the [place] was used to perform band ligation without complication.  Digital anorectal examination was then performed to assure proper positioning of the band and to adjust the banded tissue as required. No adjustment was needed.  No complications were encountered and the patient tolerated the procedure well.  Dietary and behavioral recommendations were given and along with follow-up instructions.   Return for possible additional hemorrhoid banding in 2 - 3 weeks as required.  High fiber diet, Benefiber daily and at least 8 glasses of water daily. The patient was discharged home without pain or other post procedure problems.

## 2022-03-28 ENCOUNTER — Ambulatory Visit: Payer: Medicare HMO | Admitting: Gastroenterology

## 2022-03-28 ENCOUNTER — Encounter: Payer: Self-pay | Admitting: Gastroenterology

## 2022-03-28 VITALS — BP 126/74 | HR 68 | Ht 63.0 in | Wt 174.0 lb

## 2022-03-28 DIAGNOSIS — K640 First degree hemorrhoids: Secondary | ICD-10-CM

## 2022-03-28 NOTE — Progress Notes (Signed)
PROCEDURE NOTE: The patient presents with symptomatic large grade I hemorrhoids, requesting rubber band ligation of their hemorrhoidal disease.  All risks, benefits and alternative forms of therapy were described and informed consent was obtained.  RA and RP previously banding with symptoms resolved.   The anorectum was pre-medicated during digital exam with 0.125% NTG and 5% lidocaine. DRE: external tags, the RP band placed on 11/28 is palpable, otherwise negative  The decision was made to band the LL internal hemorrhoid, and the [place] was used to perform band ligation without complication.  Digital anorectal examination was then performed to assure proper positioning of the band and to adjust the banded tissue as required. No adjustment was needed.  No complications were encountered and the patient tolerated the procedure well.  Dietary and behavioral recommendations were given and along with follow-up instructions.   Return for possible additional hemorrhoid banding in 2 - 3 weeks as required.  High fiber diet, Benefiber daily and at least 8 glasses of water daily. The patient was discharged home without pain or other post procedure problems.

## 2022-03-28 NOTE — Patient Instructions (Signed)

## 2022-04-03 ENCOUNTER — Other Ambulatory Visit: Payer: Self-pay | Admitting: Family Medicine

## 2022-04-03 DIAGNOSIS — E2839 Other primary ovarian failure: Secondary | ICD-10-CM

## 2022-04-03 DIAGNOSIS — Z1231 Encounter for screening mammogram for malignant neoplasm of breast: Secondary | ICD-10-CM

## 2022-04-04 ENCOUNTER — Other Ambulatory Visit: Payer: Self-pay | Admitting: Cardiology

## 2022-04-04 DIAGNOSIS — E7800 Pure hypercholesterolemia, unspecified: Secondary | ICD-10-CM

## 2022-05-24 ENCOUNTER — Ambulatory Visit: Payer: Medicare HMO

## 2022-05-24 ENCOUNTER — Other Ambulatory Visit: Payer: Medicare HMO

## 2022-06-19 ENCOUNTER — Ambulatory Visit
Admission: RE | Admit: 2022-06-19 | Discharge: 2022-06-19 | Disposition: A | Discharge status: Home / Self Care | Payer: Medicare HMO | Source: Ambulatory Visit | Attending: Family Medicine | Admitting: Family Medicine

## 2022-06-19 ENCOUNTER — Other Ambulatory Visit: Payer: Medicare HMO

## 2022-06-19 DIAGNOSIS — Z1231 Encounter for screening mammogram for malignant neoplasm of breast: Secondary | ICD-10-CM

## 2022-06-29 ENCOUNTER — Other Ambulatory Visit: Payer: Self-pay | Admitting: Cardiology

## 2022-06-29 DIAGNOSIS — E7800 Pure hypercholesterolemia, unspecified: Secondary | ICD-10-CM

## 2022-07-26 ENCOUNTER — Other Ambulatory Visit: Payer: Self-pay | Admitting: Cardiology

## 2022-07-26 DIAGNOSIS — E7800 Pure hypercholesterolemia, unspecified: Secondary | ICD-10-CM

## 2022-08-02 ENCOUNTER — Other Ambulatory Visit: Payer: Self-pay

## 2022-08-02 DIAGNOSIS — E7800 Pure hypercholesterolemia, unspecified: Secondary | ICD-10-CM

## 2022-08-02 MED ORDER — EZETIMIBE 10 MG PO TABS
ORAL_TABLET | ORAL | 0 refills | Status: AC
Start: 2022-08-02 — End: ?

## 2022-08-02 NOTE — Telephone Encounter (Signed)
Patient has been instructed to obtain any further refills from PCP. Husband and wife verbalized understanding.

## 2022-11-20 ENCOUNTER — Ambulatory Visit: Payer: Medicare HMO | Admitting: Adult Health

## 2022-12-12 ENCOUNTER — Ambulatory Visit: Payer: Medicare HMO | Admitting: Adult Health

## 2022-12-12 ENCOUNTER — Encounter: Payer: Self-pay | Admitting: Adult Health

## 2022-12-12 VITALS — BP 114/76 | HR 61 | Temp 98.4°F | Ht 63.0 in | Wt 168.8 lb

## 2022-12-12 DIAGNOSIS — G4733 Obstructive sleep apnea (adult) (pediatric): Secondary | ICD-10-CM | POA: Diagnosis not present

## 2022-12-12 NOTE — Assessment & Plan Note (Signed)
Mild obstructive sleep apnea with excellent control compliance on nocturnal CPAP  Plan  Patient Instructions  Continue on CPAP At bedtime   Keep up good work  Work on healthy weight loss.  Do not drive if sleepy .  Follow up in 1 year with Dr. Vassie Loll  or Taryn Nave NP

## 2022-12-12 NOTE — Progress Notes (Signed)
@Patient  ID: Bridget Phelps, female    DOB: Jan 30, 1951, 72 y.o.   MRN: [id number]  Chief Complaint  Patient presents with   Follow-up    Referring provider: Richmond Campbell., PA-C  HPI: 72 yo female followed for OSA on CPAP   TEST/EVENTS :  HST 04/2013:  AHI 14/hr.   New CPAP machine 2022.   12/12/2022 Follow up: OSA  Patient returns for a 1 year follow up . She is followed for mild OSA on nocturnal CPAP . Says she is doing well. Feels she is benefiting from CPAP with decreased daytime sleepiness. Wear CPAP each night . Uses a Full face mask , uses CPAP liners.  Download shows excellent compliance with 100% usage.  Daily average usage at 6.5 hours.  Patient is on CPAP 12 hours a day.  AHI 0.2/hour Patient remains very active.  Works with her church.  Has her family with grandchildren close by. Travels some to [place]. Goes to [place] to [place] .   Allergies  Allergen Reactions   Sulfa Antibiotics Rash    Immunization History  Administered Date(s) Administered   Covid-19, Mrna,Vaccine(Spikevax)44yrs and older 01/29/2022   Influenza Split 12/15/2012, 01/26/2013, 01/15/2015, 01/22/2017, 01/29/2020   Influenza, High Dose Seasonal PF 01/22/2018, 01/12/2019   Influenza,inj,Quad PF,6+ Mos 01/26/2013   Influenza-Unspecified 01/26/2013, 01/15/2015, 02/07/2021   Moderna Sars-Covid-2 Vaccination 05/25/2019, 06/21/2019, 02/11/2020, 12/29/2020   PFIZER Comirnaty(Gray Top)Covid-19 Tri-Sucrose Vaccine 09/08/2020   Pneumococcal Conjugate-13 07/20/2015   Pneumococcal Polysaccharide-23 03/05/2018   Zoster, Live 01/31/2010    Past Medical History:  Diagnosis Date   GERD (gastroesophageal reflux disease)    Hypercholesterolemia    no Rx meds   Hypertension    controlled on lisinopril/hctz   Sleep apnea    does have cpap   Tubular adenoma of colon 07/2011    Tobacco History: Social History   Tobacco Use  Smoking Status Never  Smokeless Tobacco Never   Counseling  given: Not Answered   Outpatient Medications Prior to Visit  Medication Sig Dispense Refill   Bee Pollen 1000 MG TABS Take by mouth.     dorzolamide-timolol (COSOPT) 22.3-6.8 MG/ML ophthalmic solution 1 drop 2 (two) times daily.     ezetimibe (ZETIA) 10 MG tablet TAKE 1 TABLET BY MOUTH ONCE DAILY AFTER SUPPER 30 tablet 0   losartan-hydrochlorothiazide (HYZAAR) 50-12.5 MG tablet Take 1 tablet by mouth daily.     Multiple Vitamins-Minerals (WOMENS MULTIVITAMIN + COLLAGEN PO) Take 1 capsule by mouth daily. MULTI COLLAGEN PROTEIN     psyllium (METAMUCIL SMOOTH TEXTURE) 28 % packet Take 1 packet by mouth daily.     rosuvastatin (CRESTOR) 20 MG tablet Take 1 tablet (20 mg total) by mouth daily. 90 tablet 0   No facility-administered medications prior to visit.     Review of Systems:   Constitutional:   No  weight loss, night sweats,  Fevers, chills, fatigue, or  lassitude.  HEENT:   No headaches,  Difficulty swallowing,  Tooth/dental problems, or  Sore throat,                No sneezing, itching, ear ache, nasal congestion, post nasal drip,   CV:  No chest pain,  Orthopnea, PND, swelling in lower extremities, anasarca, dizziness, palpitations, syncope.   GI  No heartburn, indigestion, abdominal pain, nausea, vomiting, diarrhea, change in bowel habits, loss of appetite, bloody stools.   Resp: No shortness of breath with exertion or at rest.  No excess  mucus, no productive cough,  No non-productive cough,  No coughing up of blood.  No change in color of mucus.  No wheezing.  No chest wall deformity  Skin: no rash or lesions.  GU: no dysuria, change in color of urine, no urgency or frequency.  No flank pain, no hematuria   MS:  No joint pain or swelling.  No decreased range of motion.  No back pain.    Physical Exam  BP 114/76 (BP Location: Left Arm, Cuff Size: Normal)   Pulse 61   Temp 98.4 F (36.9 C) (Temporal)   Ht 5\' 3"  (1.6 m)   Wt 168 lb 12.8 oz (76.6 kg)   SpO2 96%   BMI  29.90 kg/m   GEN: A/Ox3; pleasant , NAD, well nourished    HEENT:  [place]/AT,   NOSE-clear, THROAT-clear, no lesions, no postnasal drip or exudate noted. Class 3 MP airway   NECK:  Supple w/ fair ROM; no JVD; normal carotid impulses w/o bruits; no thyromegaly or nodules palpated; no lymphadenopathy.    RESP  Clear  P & A; w/o, wheezes/ rales/ or rhonchi. no accessory muscle use, no dullness to percussion  CARD:  RRR, no m/r/g, no peripheral edema, pulses intact, no cyanosis or clubbing.  GI:   Soft & nt; nml bowel sounds; no organomegaly or masses detected.   Musco: Warm bil, no deformities or joint swelling noted.   Neuro: alert, no focal deficits noted.    Skin: Warm, no lesions or rashes    Lab Results:  CBC No results found for: "WBC", "RBC", "HGB", "HCT", "PLT", "MCV", "MCH", "MCHC", "RDW", "LYMPHSABS", "MONOABS", "EOSABS", "BASOSABS"  BMET No results found for: "NA", "K", "CL", "CO2", "GLUCOSE", "BUN", "CREATININE", "CALCIUM", "GFRNONAA", "GFRAA"  BNP No results found for: "BNP"  ProBNP No results found for: "PROBNP"  Imaging: No results found.  Administration History     None           No data to display          No results found for: "NITRICOXIDE"      Assessment & Plan:   OSA (obstructive sleep apnea) Mild obstructive sleep apnea with excellent control compliance on nocturnal CPAP  Plan  Patient Instructions  Continue on CPAP At bedtime   Keep up good work  Work on healthy weight loss.  Do not drive if sleepy .  Follow up in 1 year with Dr. Vassie Loll  or Eddie Payette NP        Rubye Oaks, NP 12/12/2022

## 2022-12-12 NOTE — Patient Instructions (Signed)
Continue on CPAP At bedtime   Keep up good work  Work on healthy weight loss.  Do not drive if sleepy .  Follow up in 1 year with Dr. Vassie Loll  or Alpha Chouinard NP

## 2023-06-19 DIAGNOSIS — G4733 Obstructive sleep apnea (adult) (pediatric): Secondary | ICD-10-CM

## 2023-10-30 ENCOUNTER — Telehealth: Payer: Self-pay | Admitting: Adult Health

## 2023-10-30 DIAGNOSIS — G4733 Obstructive sleep apnea (adult) (pediatric): Secondary | ICD-10-CM

## 2023-10-30 NOTE — Telephone Encounter (Signed)
 Copied from CRM [id number]. Topic: Clinical - Order For Equipment >> Oct 30, 2023 11:10 AM Bridget Phelps wrote: Reason for CRM: Pt is requesting an order for CPAP supplies through [facility]. Pt stated she changed insurances and needs the order to be approved by NP Tammy Parrett and her insurance. Pt stated [facility] needs confirmation from NP Tammy Parrett that the pt still needs and is using the CPAP machine. Pt's phone number [phone] ok to leave a vm.  Patient last seen 11/2022 and is scheduled 12/31/2023 with Tammy P. Please advise

## 2023-10-30 NOTE — Telephone Encounter (Signed)
 Called and spoke with patient, she changed insurance in January and the DME is wanting her to have an office visit.  She has one schedule for 9/2, this was the earliest we had at the time.  I was able to schedule her an OV with Tammy on 12/17/23 at 1:30 PM, advised to arrive by 1:15 pm for check in.  She verbalized understanding.  Order sent to [facility] for supplies.  Nothing further needed.

## 2023-12-17 ENCOUNTER — Ambulatory Visit (INDEPENDENT_AMBULATORY_CARE_PROVIDER_SITE_OTHER): Admitting: Adult Health

## 2023-12-17 ENCOUNTER — Encounter: Payer: Self-pay | Admitting: Adult Health

## 2023-12-17 VITALS — BP 118/80 | HR 65 | Ht 63.0 in | Wt 177.6 lb

## 2023-12-17 DIAGNOSIS — G4733 Obstructive sleep apnea (adult) (pediatric): Secondary | ICD-10-CM

## 2023-12-17 DIAGNOSIS — H6991 Unspecified Eustachian tube disorder, right ear: Secondary | ICD-10-CM

## 2023-12-17 DIAGNOSIS — Z6831 Body mass index (BMI) 31.0-31.9, adult: Secondary | ICD-10-CM | POA: Diagnosis not present

## 2023-12-17 DIAGNOSIS — H6990 Unspecified Eustachian tube disorder, unspecified ear: Secondary | ICD-10-CM | POA: Insufficient documentation

## 2023-12-17 NOTE — Assessment & Plan Note (Signed)
 Continue with activity and healthy diet .

## 2023-12-17 NOTE — Patient Instructions (Addendum)
 Continue on CPAP At bedtime   Keep up good work  Work on healthy weight loss.  Do not drive if sleepy .  Claritin 10mg  daily for 5 days then As needed   Saline nasal spray Twice daily  As needed   Follow up in 1 year with Dr. Jude  or Orlie NP

## 2023-12-17 NOTE — Progress Notes (Signed)
 @Patient  ID: Bridget Phelps, female    DOB: 09-Jul-1950, 73 y.o.   MRN: [id number]  Chief Complaint  Patient presents with   Sleep Apnea    Referring provider: Debrah Josette ORN., PA-C  HPI: 73 yo female followed for OSA on CPAP   TEST/EVENTS :  HST 04/2013:  AHI 14/hr.   New CPAP machine 2022.   12/17/2023 Follow up : OSA  Patient presents for a 1 year follow-up.  Patient has mild obstructive sleep apnea is on nocturnal CPAP.  States that she cannot sleep without her CPAP.  She feels that she benefits from CPAP with decreased daytime sleepiness she uses her CPAP every single night for typically around 7 to 8 hours.  CPAP download shows excellent compliance with 100% usage.  Daily average usage at 7.5 hours.  She is on CPAP 12 cm H2O.  AHI 0.3/hour.  Minimal leaks.  Uses a fullface mask.  Says that her CPAP supplies need to be sent to [facility] as her insurance has changed its DME contract.  Patient complains over the last couple weeks that she has had sinus drainage and ear fullness.  Feels like she had a little cold initially and continues to have some lingering symptoms.  She has not taken any over-the-counter medications.  No fever or discolored mucus.   Allergies  Allergen Reactions   Sulfa Antibiotics Rash    Immunization History  Administered Date(s) Administered   INFLUENZA, HIGH DOSE SEASONAL PF 01/22/2018, 01/12/2019   Influenza Split 12/15/2012, 01/26/2013, 01/15/2015, 01/22/2017, 01/29/2020   Influenza,inj,Quad PF,6+ Mos 01/26/2013   Influenza-Unspecified 01/26/2013, 01/15/2015, 02/07/2021   Moderna Covid-19 Fall Seasonal Vaccine 68yrs & older 01/29/2022   Moderna Sars-Covid-2 Vaccination 05/25/2019, 06/21/2019, 02/11/2020, 12/29/2020   PFIZER Comirnaty(Gray Top)Covid-19 Tri-Sucrose Vaccine 09/08/2020   Pneumococcal Conjugate-13 07/20/2015   Pneumococcal Polysaccharide-23 03/05/2018   Zoster, Live 01/31/2010    Past Medical History:  Diagnosis Date   GERD  (gastroesophageal reflux disease)    Hypercholesterolemia    no Rx meds   Hypertension    controlled on lisinopril/hctz   Sleep apnea    does have cpap   Tubular adenoma of colon 07/2011    Tobacco History: Social History   Tobacco Use  Smoking Status Never  Smokeless Tobacco Never   Counseling given: Not Answered   Outpatient Medications Prior to Visit  Medication Sig Dispense Refill   dorzolamide-timolol (COSOPT) 22.3-6.8 MG/ML ophthalmic solution 1 drop 2 (two) times daily.     ezetimibe  (ZETIA ) 10 MG tablet TAKE 1 TABLET BY MOUTH ONCE DAILY AFTER SUPPER 30 tablet 0   losartan-hydrochlorothiazide (HYZAAR) 50-12.5 MG tablet Take 1 tablet by mouth daily.     Multiple Vitamins-Minerals (WOMENS MULTIVITAMIN + COLLAGEN PO) Take 1 capsule by mouth daily. MULTI COLLAGEN PROTEIN     psyllium (METAMUCIL SMOOTH TEXTURE) 28 % packet Take 1 packet by mouth daily.     rosuvastatin  (CRESTOR ) 20 MG tablet Take 1 tablet (20 mg total) by mouth daily. 90 tablet 0   Bee Pollen 1000 MG TABS Take by mouth.     No facility-administered medications prior to visit.     Review of Systems:   Constitutional:   No  weight loss, night sweats,  Fevers, chills, fatigue, or  lassitude.  HEENT:   No headaches,  Difficulty swallowing,  Tooth/dental problems, or  Sore throat,                No sneezing, itching, ear ache, +nasal congestion, post nasal  drip,   CV:  No chest pain,  Orthopnea, PND, swelling in lower extremities, anasarca, dizziness, palpitations, syncope.   GI  No heartburn, indigestion, abdominal pain, nausea, vomiting, diarrhea, change in bowel habits, loss of appetite, bloody stools.   Resp: No shortness of breath with exertion or at rest.  No excess mucus, no productive cough,  No non-productive cough,  No coughing up of blood.  No change in color of mucus.  No wheezing.  No chest wall deformity  Skin: no rash or lesions.  GU: no dysuria, change in color of urine, no urgency or  frequency.  No flank pain, no hematuria   MS:  No joint pain or swelling.  No decreased range of motion.  No back pain.    Physical Exam  BP 118/80 (BP Location: Left Arm, Patient Position: Sitting)   Pulse 65   Ht 5' 3 (1.6 m)   Wt 177 lb 9.6 oz (80.6 kg)   SpO2 97% Comment: RA  BMI 31.46 kg/m   GEN: A/Ox3; pleasant , NAD, well nourished    HEENT:  [place]/AT,  NOSE-clear, THROAT-clear, no lesions, no postnasal drip or exudate noted. EAC clear   NECK:  Supple w/ fair ROM; no JVD; normal carotid impulses w/o bruits; no thyromegaly or nodules palpated; no lymphadenopathy.    RESP  Clear  P & A; w/o, wheezes/ rales/ or rhonchi. no accessory muscle use, no dullness to percussion  CARD:  RRR, no m/r/g, no peripheral edema, pulses intact, no cyanosis or clubbing.  GI:   Soft & nt; nml bowel sounds; no organomegaly or masses detected.   Musco: Warm bil, no deformities or joint swelling noted.   Neuro: alert, no focal deficits noted.    Skin: Warm, no lesions or rashes    Lab Results:  CBC No results found for: WBC, RBC, HGB, HCT, PLT, MCV, MCH, MCHC, RDW, LYMPHSABS, MONOABS, EOSABS, BASOSABS  BMET No results found for: NA, K, CL, CO2, GLUCOSE, BUN, CREATININE, CALCIUM , GFRNONAA, GFRAA  BNP No results found for: BNP  ProBNP No results found for: PROBNP  Imaging: No results found.  Administration History     None           No data to display          No results found for: NITRICOXIDE      Assessment & Plan:   OSA (obstructive sleep apnea) Excellent control and compliance on nocturnal CPAP.  Continue all current settings CPAP care discussed in detail.  Order sent for new supplies to [facility]  Plan  Patient Instructions  Continue on CPAP At bedtime   Keep up good work  Work on healthy weight loss.  Do not drive if sleepy .  Claritin 10mg  daily for 5 days then As needed   Saline nasal spray Twice  daily  As needed   Follow up in 1 year with Dr. Jude  or Onnika Siebel NP      Eustachian tube dysfunction Add Claritin x 5 days then As needed   Saline nasal spray As needed     Morbid obesity (HCC) Continue with activity and healthy diet .     Madelin Stank, NP 12/17/2023

## 2023-12-17 NOTE — Assessment & Plan Note (Signed)
 Excellent control and compliance on nocturnal CPAP.  Continue all current settings CPAP care discussed in detail.  Order sent for new supplies to [facility]  Plan  Patient Instructions  Continue on CPAP At bedtime   Keep up good work  Work on healthy weight loss.  Do not drive if sleepy .  Claritin 10mg  daily for 5 days then As needed   Saline nasal spray Twice daily  As needed   Follow up in 1 year with Dr. Jude  or Orlie NP

## 2023-12-17 NOTE — Assessment & Plan Note (Signed)
 Add Claritin x 5 days then As needed   Saline nasal spray As needed

## 2023-12-31 ENCOUNTER — Ambulatory Visit: Admitting: Adult Health
